# Patient Record
Sex: Female | Born: 1950
Health system: Southern US, Community
[De-identification: ages and names within clinical notes are randomized; demographics above are authoritative.]

## PROBLEM LIST (undated history)

## (undated) DIAGNOSIS — N643 Galactorrhea not associated with childbirth: Secondary | ICD-10-CM

## (undated) DIAGNOSIS — K589 Irritable bowel syndrome without diarrhea: Secondary | ICD-10-CM

## (undated) DIAGNOSIS — M7710 Lateral epicondylitis, unspecified elbow: Secondary | ICD-10-CM

## (undated) DIAGNOSIS — K219 Gastro-esophageal reflux disease without esophagitis: Secondary | ICD-10-CM

## (undated) DIAGNOSIS — E538 Deficiency of other specified B group vitamins: Secondary | ICD-10-CM

## (undated) DIAGNOSIS — D352 Benign neoplasm of pituitary gland: Secondary | ICD-10-CM

## (undated) DIAGNOSIS — K120 Recurrent oral aphthae: Secondary | ICD-10-CM

## (undated) DIAGNOSIS — K649 Unspecified hemorrhoids: Secondary | ICD-10-CM

## (undated) DIAGNOSIS — E559 Vitamin D deficiency, unspecified: Secondary | ICD-10-CM

## (undated) DIAGNOSIS — T7840XA Allergy, unspecified, initial encounter: Secondary | ICD-10-CM

## (undated) DIAGNOSIS — M659 Synovitis and tenosynovitis, unspecified: Secondary | ICD-10-CM

## (undated) DIAGNOSIS — B009 Herpesviral infection, unspecified: Secondary | ICD-10-CM

## (undated) HISTORY — DX: Lateral epicondylitis, unspecified elbow: M77.10

## (undated) HISTORY — DX: Vitamin D deficiency, unspecified: E55.9

## (undated) HISTORY — DX: Galactorrhea not associated with childbirth: N64.3

## (undated) HISTORY — DX: Allergy, unspecified, initial encounter: T78.40XA

## (undated) HISTORY — DX: Gastro-esophageal reflux disease without esophagitis: K21.9

## (undated) HISTORY — DX: Unspecified hemorrhoids: K64.9

## (undated) HISTORY — DX: Irritable bowel syndrome, unspecified: K58.9

## (undated) HISTORY — DX: Recurrent oral aphthae: K12.0

## (undated) HISTORY — DX: Herpesviral infection, unspecified: B00.9

## (undated) HISTORY — DX: Synovitis and tenosynovitis, unspecified: M65.9

## (undated) HISTORY — DX: Deficiency of other specified B group vitamins: E53.8

## (undated) HISTORY — DX: Benign neoplasm of pituitary gland: D35.2

## (undated) HISTORY — PX: DILATION AND CURETTAGE OF UTERUS: SHX78

---

## 1999-04-04 ENCOUNTER — Other Ambulatory Visit: Admission: RE | Admit: 1999-04-04 | Discharge: 1999-04-04 | Payer: Self-pay | Admitting: Gynecology

## 1999-05-02 ENCOUNTER — Encounter: Payer: Self-pay | Admitting: Gynecology

## 1999-05-02 ENCOUNTER — Ambulatory Visit (HOSPITAL_COMMUNITY): Admission: RE | Admit: 1999-05-02 | Discharge: 1999-05-02 | Payer: Self-pay | Admitting: Gynecology

## 1999-08-29 ENCOUNTER — Encounter: Payer: Self-pay | Admitting: Gynecology

## 1999-08-29 ENCOUNTER — Encounter: Admission: RE | Admit: 1999-08-29 | Discharge: 1999-08-29 | Payer: Self-pay | Admitting: Gynecology

## 1999-10-24 ENCOUNTER — Other Ambulatory Visit: Admission: RE | Admit: 1999-10-24 | Discharge: 1999-10-24 | Payer: Self-pay | Admitting: Gynecology

## 2000-09-09 ENCOUNTER — Encounter: Admission: RE | Admit: 2000-09-09 | Discharge: 2000-09-09 | Payer: Self-pay | Admitting: Gynecology

## 2000-09-09 ENCOUNTER — Encounter: Payer: Self-pay | Admitting: Gynecology

## 2000-09-12 ENCOUNTER — Encounter: Payer: Self-pay | Admitting: Gynecology

## 2000-09-12 ENCOUNTER — Encounter: Admission: RE | Admit: 2000-09-12 | Discharge: 2000-09-12 | Payer: Self-pay | Admitting: Gynecology

## 2000-09-24 ENCOUNTER — Ambulatory Visit (HOSPITAL_COMMUNITY): Admission: RE | Admit: 2000-09-24 | Discharge: 2000-09-24 | Payer: Self-pay | Admitting: Internal Medicine

## 2000-09-24 ENCOUNTER — Encounter: Payer: Self-pay | Admitting: Internal Medicine

## 2001-09-18 ENCOUNTER — Ambulatory Visit (HOSPITAL_COMMUNITY): Admission: RE | Admit: 2001-09-18 | Discharge: 2001-09-18 | Payer: Self-pay | Admitting: Gynecology

## 2001-09-18 ENCOUNTER — Encounter: Payer: Self-pay | Admitting: Gynecology

## 2001-11-11 DIAGNOSIS — M65949 Unspecified synovitis and tenosynovitis, unspecified hand: Secondary | ICD-10-CM

## 2001-11-11 DIAGNOSIS — M659 Synovitis and tenosynovitis, unspecified: Secondary | ICD-10-CM

## 2001-11-11 HISTORY — DX: Unspecified synovitis and tenosynovitis, unspecified hand: M65.949

## 2001-11-11 HISTORY — DX: Synovitis and tenosynovitis, unspecified: M65.9

## 2001-11-20 ENCOUNTER — Other Ambulatory Visit: Admission: RE | Admit: 2001-11-20 | Discharge: 2001-11-20 | Payer: Self-pay | Admitting: Gynecology

## 2002-03-03 ENCOUNTER — Encounter: Payer: Self-pay | Admitting: Internal Medicine

## 2002-03-03 ENCOUNTER — Ambulatory Visit (HOSPITAL_COMMUNITY): Admission: RE | Admit: 2002-03-03 | Discharge: 2002-03-03 | Payer: Self-pay | Admitting: Internal Medicine

## 2002-09-22 ENCOUNTER — Encounter: Payer: Self-pay | Admitting: Gynecology

## 2002-09-22 ENCOUNTER — Ambulatory Visit (HOSPITAL_COMMUNITY): Admission: RE | Admit: 2002-09-22 | Discharge: 2002-09-22 | Payer: Self-pay | Admitting: Gynecology

## 2002-11-26 ENCOUNTER — Other Ambulatory Visit: Admission: RE | Admit: 2002-11-26 | Discharge: 2002-11-26 | Payer: Self-pay | Admitting: Gynecology

## 2003-09-26 ENCOUNTER — Ambulatory Visit (HOSPITAL_COMMUNITY): Admission: RE | Admit: 2003-09-26 | Discharge: 2003-09-26 | Payer: Self-pay | Admitting: Gynecology

## 2003-11-29 ENCOUNTER — Other Ambulatory Visit: Admission: RE | Admit: 2003-11-29 | Discharge: 2003-11-29 | Payer: Self-pay | Admitting: Gynecology

## 2003-12-23 ENCOUNTER — Ambulatory Visit (HOSPITAL_COMMUNITY): Admission: RE | Admit: 2003-12-23 | Discharge: 2003-12-23 | Payer: Self-pay | Admitting: Gynecology

## 2004-07-11 ENCOUNTER — Ambulatory Visit (HOSPITAL_COMMUNITY): Admission: RE | Admit: 2004-07-11 | Discharge: 2004-07-11 | Payer: Self-pay | Admitting: Internal Medicine

## 2004-09-26 ENCOUNTER — Ambulatory Visit (HOSPITAL_COMMUNITY): Admission: RE | Admit: 2004-09-26 | Discharge: 2004-09-26 | Payer: Self-pay | Admitting: Gynecology

## 2004-12-04 ENCOUNTER — Other Ambulatory Visit: Admission: RE | Admit: 2004-12-04 | Discharge: 2004-12-04 | Payer: Self-pay | Admitting: Gynecology

## 2005-10-08 ENCOUNTER — Ambulatory Visit (HOSPITAL_COMMUNITY): Admission: RE | Admit: 2005-10-08 | Discharge: 2005-10-08 | Payer: Self-pay | Admitting: Gynecology

## 2005-11-11 DIAGNOSIS — M7710 Lateral epicondylitis, unspecified elbow: Secondary | ICD-10-CM

## 2005-11-11 HISTORY — DX: Lateral epicondylitis, unspecified elbow: M77.10

## 2005-12-06 ENCOUNTER — Other Ambulatory Visit: Admission: RE | Admit: 2005-12-06 | Discharge: 2005-12-06 | Payer: Self-pay | Admitting: Gynecology

## 2006-10-09 ENCOUNTER — Ambulatory Visit (HOSPITAL_COMMUNITY): Admission: RE | Admit: 2006-10-09 | Discharge: 2006-10-09 | Payer: Self-pay | Admitting: Gynecology

## 2006-12-09 ENCOUNTER — Other Ambulatory Visit: Admission: RE | Admit: 2006-12-09 | Discharge: 2006-12-09 | Payer: Self-pay | Admitting: Gynecology

## 2007-10-20 ENCOUNTER — Ambulatory Visit (HOSPITAL_COMMUNITY): Admission: RE | Admit: 2007-10-20 | Discharge: 2007-10-20 | Payer: Self-pay | Admitting: Gynecology

## 2007-10-27 ENCOUNTER — Encounter: Admission: RE | Admit: 2007-10-27 | Discharge: 2007-10-27 | Payer: Self-pay | Admitting: Gynecology

## 2007-12-24 ENCOUNTER — Ambulatory Visit (HOSPITAL_COMMUNITY): Admission: RE | Admit: 2007-12-24 | Discharge: 2007-12-24 | Payer: Self-pay | Admitting: Gynecology

## 2008-05-09 ENCOUNTER — Ambulatory Visit: Payer: Self-pay | Admitting: Internal Medicine

## 2008-06-21 ENCOUNTER — Ambulatory Visit: Payer: Self-pay | Admitting: Internal Medicine

## 2008-10-20 ENCOUNTER — Encounter: Admission: RE | Admit: 2008-10-20 | Discharge: 2008-10-20 | Payer: Self-pay | Admitting: Internal Medicine

## 2008-11-01 ENCOUNTER — Encounter: Admission: RE | Admit: 2008-11-01 | Discharge: 2008-11-01 | Payer: Self-pay | Admitting: Internal Medicine

## 2008-12-19 ENCOUNTER — Other Ambulatory Visit: Admission: RE | Admit: 2008-12-19 | Discharge: 2008-12-19 | Payer: Self-pay | Admitting: Obstetrics and Gynecology

## 2009-01-11 ENCOUNTER — Encounter: Payer: Self-pay | Admitting: Internal Medicine

## 2009-10-23 ENCOUNTER — Encounter: Admission: RE | Admit: 2009-10-23 | Discharge: 2009-10-23 | Payer: Self-pay | Admitting: Obstetrics and Gynecology

## 2010-01-02 ENCOUNTER — Other Ambulatory Visit: Admission: RE | Admit: 2010-01-02 | Discharge: 2010-01-02 | Payer: Self-pay | Admitting: Obstetrics and Gynecology

## 2010-10-25 ENCOUNTER — Encounter
Admission: RE | Admit: 2010-10-25 | Discharge: 2010-10-25 | Payer: Self-pay | Source: Home / Self Care | Attending: Internal Medicine | Admitting: Internal Medicine

## 2010-12-02 ENCOUNTER — Encounter: Payer: Self-pay | Admitting: Gynecology

## 2011-01-10 ENCOUNTER — Other Ambulatory Visit: Payer: Self-pay | Admitting: Obstetrics and Gynecology

## 2011-01-10 ENCOUNTER — Other Ambulatory Visit (HOSPITAL_COMMUNITY)
Admission: RE | Admit: 2011-01-10 | Discharge: 2011-01-10 | Disposition: A | Discharge status: Home / Self Care | Payer: BC Managed Care – PPO | Source: Ambulatory Visit | Attending: Obstetrics and Gynecology | Admitting: Obstetrics and Gynecology

## 2011-01-10 DIAGNOSIS — Z01419 Encounter for gynecological examination (general) (routine) without abnormal findings: Secondary | ICD-10-CM | POA: Insufficient documentation

## 2011-09-12 ENCOUNTER — Other Ambulatory Visit: Payer: Self-pay | Admitting: Internal Medicine

## 2011-09-12 DIAGNOSIS — Z1231 Encounter for screening mammogram for malignant neoplasm of breast: Secondary | ICD-10-CM

## 2011-10-22 ENCOUNTER — Ambulatory Visit
Admission: RE | Admit: 2011-10-22 | Discharge: 2011-10-22 | Disposition: A | Discharge status: Home / Self Care | Payer: BC Managed Care – PPO | Source: Ambulatory Visit | Attending: Internal Medicine | Admitting: Internal Medicine

## 2011-10-22 DIAGNOSIS — Z1231 Encounter for screening mammogram for malignant neoplasm of breast: Secondary | ICD-10-CM

## 2012-02-27 ENCOUNTER — Other Ambulatory Visit: Payer: Self-pay | Admitting: Obstetrics and Gynecology

## 2012-02-27 ENCOUNTER — Other Ambulatory Visit (HOSPITAL_COMMUNITY)
Admission: RE | Admit: 2012-02-27 | Discharge: 2012-02-27 | Disposition: A | Discharge status: Home / Self Care | Payer: BC Managed Care – PPO | Source: Ambulatory Visit | Attending: Obstetrics and Gynecology | Admitting: Obstetrics and Gynecology

## 2012-02-27 DIAGNOSIS — Z01419 Encounter for gynecological examination (general) (routine) without abnormal findings: Secondary | ICD-10-CM | POA: Insufficient documentation

## 2012-04-22 ENCOUNTER — Other Ambulatory Visit: Payer: Self-pay | Admitting: Dermatology

## 2012-09-15 ENCOUNTER — Other Ambulatory Visit: Payer: Self-pay | Admitting: Internal Medicine

## 2012-09-15 DIAGNOSIS — Z1231 Encounter for screening mammogram for malignant neoplasm of breast: Secondary | ICD-10-CM

## 2012-09-16 ENCOUNTER — Other Ambulatory Visit: Payer: Self-pay | Admitting: Internal Medicine

## 2012-09-16 DIAGNOSIS — M949 Disorder of cartilage, unspecified: Secondary | ICD-10-CM

## 2012-10-22 ENCOUNTER — Ambulatory Visit: Payer: BC Managed Care – PPO

## 2012-10-26 ENCOUNTER — Other Ambulatory Visit: Payer: BC Managed Care – PPO

## 2012-10-27 ENCOUNTER — Ambulatory Visit
Admission: RE | Admit: 2012-10-27 | Discharge: 2012-10-27 | Disposition: A | Discharge status: Home / Self Care | Payer: BC Managed Care – PPO | Source: Ambulatory Visit | Attending: Internal Medicine | Admitting: Internal Medicine

## 2012-10-27 DIAGNOSIS — M949 Disorder of cartilage, unspecified: Secondary | ICD-10-CM

## 2012-10-27 DIAGNOSIS — Z1231 Encounter for screening mammogram for malignant neoplasm of breast: Secondary | ICD-10-CM

## 2013-02-03 ENCOUNTER — Telehealth: Payer: Self-pay | Admitting: Internal Medicine

## 2013-02-03 NOTE — Telephone Encounter (Signed)
Left message for pt to call back  °

## 2013-02-04 NOTE — Telephone Encounter (Signed)
If she is not having problems, given her father's age at diagnosis, 10 years would be appropriate (based on national GI guidelines). Thanks

## 2013-02-04 NOTE — Telephone Encounter (Signed)
Recall date changed to 2019 and pt aware.

## 2013-02-04 NOTE — Telephone Encounter (Signed)
Pt had a colon in 2009. Originally the recall was in for 10 years but per procedure note pts father was diagnosed with colon cancer and the recall was changed to 5 years. Pt states that her father was 69 at the time of diagnosis and it was noninvasive. Pt wants to know if she still needs the recall at 5 years due to the age her father was diagnosed. Please advise.

## 2013-03-09 ENCOUNTER — Other Ambulatory Visit: Payer: Self-pay | Admitting: Obstetrics and Gynecology

## 2013-03-09 ENCOUNTER — Other Ambulatory Visit (HOSPITAL_COMMUNITY)
Admission: RE | Admit: 2013-03-09 | Discharge: 2013-03-09 | Disposition: A | Discharge status: Home / Self Care | Payer: BC Managed Care – PPO | Source: Ambulatory Visit | Attending: Obstetrics and Gynecology | Admitting: Obstetrics and Gynecology

## 2013-03-09 DIAGNOSIS — Z1151 Encounter for screening for human papillomavirus (HPV): Secondary | ICD-10-CM | POA: Insufficient documentation

## 2013-03-09 DIAGNOSIS — Z01419 Encounter for gynecological examination (general) (routine) without abnormal findings: Secondary | ICD-10-CM | POA: Insufficient documentation

## 2013-05-19 ENCOUNTER — Encounter: Payer: Self-pay | Admitting: Internal Medicine

## 2013-09-27 ENCOUNTER — Encounter: Payer: Self-pay | Admitting: Internal Medicine

## 2013-09-27 DIAGNOSIS — E559 Vitamin D deficiency, unspecified: Secondary | ICD-10-CM | POA: Insufficient documentation

## 2013-09-27 DIAGNOSIS — E538 Deficiency of other specified B group vitamins: Secondary | ICD-10-CM | POA: Insufficient documentation

## 2013-09-27 DIAGNOSIS — K219 Gastro-esophageal reflux disease without esophagitis: Secondary | ICD-10-CM | POA: Insufficient documentation

## 2013-09-27 DIAGNOSIS — K589 Irritable bowel syndrome without diarrhea: Secondary | ICD-10-CM | POA: Insufficient documentation

## 2013-09-27 NOTE — Patient Instructions (Signed)
Continue diet & medications same as discussed.   Further disposition pending lab results.      Irritable Bowel Syndrome Irritable Bowel Syndrome (IBS) is caused by a disturbance of normal bowel function. Other terms used are spastic colon, mucous colitis, and irritable colon. It does not require surgery, nor does it lead to cancer. There is no cure for IBS. But with proper diet, stress reduction, and medication, you will find that your problems (symptoms) will gradually disappear or improve. IBS is a common digestive disorder. It usually appears in late adolescence or early adulthood. Women develop it twice as often as men. CAUSES  After food has been digested and absorbed in the small intestine, waste material is moved into the colon (large intestine). In the colon, water and salts are absorbed from the undigested products coming from the small intestine. The remaining residue, or fecal material, is held for elimination. Under normal circumstances, gentle, rhythmic contractions on the bowel walls push the fecal material along the colon towards the rectum. In IBS, however, these contractions are irregular and poorly coordinated. The fecal material is either retained too long, resulting in constipation, or expelled too soon, producing diarrhea. SYMPTOMS  The most common symptom of IBS is pain. It is typically in the lower left side of the belly (abdomen). But it may occur anywhere in the abdomen. It can be felt as heartburn, backache, or even as a dull pain in the arms or shoulders. The pain comes from excessive bowel-muscle spasms and from the buildup of gas and fecal material in the colon. This pain:  Can range from sharp belly (abdominal) cramps to a dull, continuous ache.  Usually worsens soon after eating.  Is typically relieved by having a bowel movement or passing gas. Abdominal pain is usually accompanied by constipation. But it may also produce diarrhea. The diarrhea typically  occurs right after a meal or upon arising in the morning. The stools are typically soft and watery. They are often flecked with secretions (mucus). Other symptoms of IBS include:  Bloating.  Loss of appetite.  Heartburn.  Feeling sick to your stomach (nausea).  Belching  Vomiting  Gas. IBS may also cause a number of symptoms that are unrelated to the digestive system:  Fatigue.  Headaches.  Anxiety  Shortness of breath  Difficulty in concentrating.  Dizziness. These symptoms tend to come and go. DIAGNOSIS  The symptoms of IBS closely mimic the symptoms of other, more serious digestive disorders. So your caregiver may wish to perform a variety of additional tests to exclude these disorders. He/she wants to be certain of learning what is wrong (diagnosis). The nature and purpose of each test will be explained to you. TREATMENT A number of medications are available to help correct bowel function and/or relieve bowel spasms and abdominal pain. Among the drugs available are:  Mild, non-irritating laxatives for severe constipation and to help restore normal bowel habits.  Specific anti-diarrheal medications to treat severe or prolonged diarrhea.  Anti-spasmodic agents to relieve intestinal cramps.  Your caregiver may also decide to treat you with a mild tranquilizer or sedative during unusually stressful periods in your life. The important thing to remember is that if any drug is prescribed for you, make sure that you take it exactly as directed. Make sure that your caregiver knows how well it worked for you. HOME CARE INSTRUCTIONS   Avoid foods that are high in fat or oils. Some examples ZOX:WRUEA cream, butter, frankfurters, sausage, and other  fatty meats.  Avoid foods that have a laxative effect, such as fruit, fruit juice, and dairy products.  Cut out carbonated drinks, chewing gum, and "gassy" foods, such as beans and cabbage. This may help relieve bloating and  belching.  Bran taken with plenty of liquids may help relieve constipation.  Keep track of what foods seem to trigger your symptoms.  Avoid emotionally charged situations or circumstances that produce anxiety.  Start or continue exercising.  Get plenty of rest and sleep. MAKE SURE YOU:   Understand these instructions.  Will watch your condition.  Will get help right away if you are not doing well or get worse. Document Released: 10/28/2005 Document Revised: 01/20/2012 Document Reviewed: 06/17/2008 [facility] Patient Information 2014 [place], [place].    Gastroesophageal Reflux Disease, Adult Gastroesophageal reflux disease (GERD) happens when acid from your stomach flows up into the esophagus. When acid comes in contact with the esophagus, the acid causes soreness (inflammation) in the esophagus. Over time, GERD may create small holes (ulcers) in the lining of the esophagus. CAUSES   Increased body weight. This puts pressure on the stomach, making acid rise from the stomach into the esophagus.  Smoking. This increases acid production in the stomach.  Drinking alcohol. This causes decreased pressure in the lower esophageal sphincter (valve or ring of muscle between the esophagus and stomach), allowing acid from the stomach into the esophagus.  Late evening meals and a full stomach. This increases pressure and acid production in the stomach.  A malformed lower esophageal sphincter. Sometimes, no cause is found. SYMPTOMS   Burning pain in the lower part of the mid-chest behind the breastbone and in the mid-stomach area. This may occur twice a week or more often.  Trouble swallowing.  Sore throat.  Dry cough.  Asthma-like symptoms including chest tightness, shortness of breath, or wheezing. DIAGNOSIS  Your caregiver may be able to diagnose GERD based on your symptoms. In some cases, X-rays and other tests may be done to check for complications or to check the condition of  your stomach and esophagus. TREATMENT  Your caregiver may recommend over-the-counter or prescription medicines to help decrease acid production. Ask your caregiver before starting or adding any new medicines.  HOME CARE INSTRUCTIONS   Change the factors that you can control. Ask your caregiver for guidance concerning weight loss, quitting smoking, and alcohol consumption.  Avoid foods and drinks that make your symptoms worse, such as:  Caffeine or alcoholic drinks.  Chocolate.  Peppermint or mint flavorings.  Garlic and onions.  Spicy foods.  Citrus fruits, such as oranges, lemons, or limes.  Tomato-based foods such as sauce, chili, salsa, and pizza.  Fried and fatty foods.  Avoid lying down for the 3 hours prior to your bedtime or prior to taking a nap.  Eat small, frequent meals instead of large meals.  Wear loose-fitting clothing. Do not wear anything tight around your waist that causes pressure on your stomach.  Raise the head of your bed 6 to 8 inches with wood blocks to help you sleep. Extra pillows will not help.  Only take over-the-counter or prescription medicines for pain, discomfort, or fever as directed by your caregiver.  Do not take aspirin, ibuprofen, or other nonsteroidal anti-inflammatory drugs (NSAIDs). SEEK IMMEDIATE MEDICAL CARE IF:   You have pain in your arms, neck, jaw, teeth, or back.  Your pain increases or changes in intensity or duration.  You develop nausea, vomiting, or sweating (diaphoresis).  You develop shortness of  breath, or you faint.  Your vomit is green, yellow, black, or looks like coffee grounds or blood.  Your stool is red, bloody, or black. These symptoms could be signs of other problems, such as heart disease, gastric bleeding, or esophageal bleeding. MAKE SURE YOU:   Understand these instructions.  Will watch your condition.  Will get help right away if you are not doing well or get worse. Document Released:  08/07/2005 Document Revised: 01/20/2012 Document Reviewed: 05/17/2011 [employer] Patient Information 2014 [place], [place]. Vitamin D Deficiency Vitamin D is an important vitamin that your body needs. Having too little of it in your body is called a deficiency. A very bad deficiency can make your bones soft and can cause a condition called rickets.  Vitamin D is important to your body for different reasons, such as:   It helps your body absorb 2 minerals called calcium and phosphorus.  It helps make your bones healthy.  It may prevent some diseases, such as diabetes and multiple sclerosis.  It helps your muscles and heart. You can get vitamin D in several ways. It is a natural part of some foods. The vitamin is also added to some dairy products and cereals. Some people take vitamin D supplements. Also, your body makes vitamin D when you are in the sun. It changes the sun's rays into a form of the vitamin that your body can use. CAUSES   Not eating enough foods that contain vitamin D.  Not getting enough sunlight.  Having certain digestive system diseases that make it hard to absorb vitamin D. These diseases include Crohn's disease, chronic pancreatitis, and cystic fibrosis.  Having a surgery in which part of the stomach or small intestine is removed.  Being obese. Fat cells pull vitamin D out of your blood. That means that obese people may not have enough vitamin D left in their blood and in other body tissues.  Having chronic kidney or liver disease. RISK FACTORS Risk factors are things that make you more likely to develop a vitamin D deficiency. They include:  Being older.  Not being able to get outside very much.  Living in a nursing home.  Having had broken bones.  Having weak or thin bones (osteoporosis).  Having a disease or condition that changes how your body absorbs vitamin D.  Having dark skin.  Some medicines such as seizure medicines or steroids.  Being  overweight or obese. SYMPTOMS Mild cases of vitamin D deficiency may not have any symptoms. If you have a very bad case, symptoms may include:  Bone pain.  Muscle pain.  Falling often.  Broken bones caused by a minor injury, due to osteoporosis. DIAGNOSIS A blood test is the best way to tell if you have a vitamin D deficiency. TREATMENT Vitamin D deficiency can be treated in different ways. Treatment for vitamin D deficiency depends on what is causing it. Options include:  Taking vitamin D supplements.  Taking a calcium supplement. Your caregiver will suggest what dose is best for you. HOME CARE INSTRUCTIONS  Take any supplements that your caregiver prescribes. Follow the directions carefully. Take only the suggested amount.  Have your blood tested 2 months after you start taking supplements.  Eat foods that contain vitamin D. Healthy choices include:  Fortified dairy products, cereals, or juices. Fortified means vitamin D has been added to the food. Check the label on the package to be sure.  Fatty fish like salmon or trout.  Eggs.  Oysters.  Do  not use a tanning bed.  Keep your weight at a healthy level. Lose weight if you need to.  Keep all follow-up appointments. Your caregiver will need to perform blood tests to make sure your vitamin D deficiency is going away. SEEK MEDICAL CARE IF:  You have any questions about your treatment.  You continue to have symptoms of vitamin D deficiency.  You have nausea or vomiting.  You are constipated.  You feel confused.  You have severe abdominal or back pain. MAKE SURE YOU:  Understand these instructions.  Will watch your condition.  Will get help right away if you are not doing well or get worse. Document Released: 01/20/2012 Document Revised: 02/22/2013 Document Reviewed: 01/20/2012 [facility] Patient Information 2014 [place], [place].

## 2013-09-27 NOTE — Progress Notes (Signed)
Patient ID: Kimberly Murphy, female   DOB: 1951/04/28, 62 y.o.   MRN: [id number]   Annual Screening Comprehensive Examination   This very nice 62 yo MWF presents for annual comprehensive screening physical.  Patient has no major health issues. She does have history of GERD and IBS- both of which have been fairly quiescent over the last couple of years with improved diet  Very remotely patient was diagnosed with pituitary microadenomas and saw Dr Danielle Dess who advised that his review of MRI suspected normal variant. Prolactin levels over the years have remained slightly elevated, but stable.   Finally, patient has history of Vitamin D Deficiency with last vitamin D      Current outpatient prescriptions: Aspirin 81 MG tablet, Take 81 mg by mouth daily., CALCIUM CITRATE PO, Take by mouth. Cholecalciferol (VITAMIN D-3) 5000 UNITS TABS, Take by mouth.   Cyanocobalamin (B-12) 1000 MCG SUBL, Place under the tongue.  (VIVELLE-DOT) 0.05 MG/24HR patch, Place 1 patch onto the skin once a week. 5 days per week Progesterone (PROMETRIUM) 200 MG capsule, Take 200 mg by mouth. hyoscyamine (LEVSIN, ANASPAZ) 0.125 MG tablet, Take 0.125 mg by mouth as  Loratadine-pseudoephedrine (CLARITIN-D 12-HOUR) 5-120 MG per tablet, Take 1 tablet by mouth as needed for allergies.   Magnesium 250 MG TABS, Take by mouth 2 (two) times daily meloxicam (MOBIC) 15 MG tablet, Take 15 mg by mouth daily montelukast (SINGULAIR) 10 MG tablet, Take 10 mg by mouth daily    Allergies  Allergen Reactions  . Cimetidine     Diarrhea  . Pse Hcl-Dm Hbr-Guaifenesin Tan     Increased Heart rate  . Seldane [Terfenadine]     Rapid palpitations  . Shellfish Allergy     N/V, diarrhea    Past Medical History  Diagnosis Date  . IBS (irritable bowel syndrome)   . GERD (gastroesophageal reflux disease)   . Vitamin D deficiency   . B12 deficiency   . Allergy   . Galactorrhea     with elevated Prolactin levels  . Pituitary  microadenoma   . Hemorrhoid   . Aphthous stomatitis   . HSV-1 (herpes simplex virus 1) infection   . Tennis elbow 2007    Right elbow  . Synovitis of finger 2003    Left ring finger    Past Surgical History  Procedure Laterality Date  . Dilation and curettage of uterus  1994 & 1996    Family History  Problem Relation Age of Onset  . Cancer Mother     Breast  . Ulcerative colitis Mother   . Arthritis Father   . Hypertension Father   . Macular degeneration Father     History   Social History  . Marital Status: Married    Spouse Name: N/A    Number of Children: N/A  . Years of Education: N/A   Occupational History  . Not on file.   Social History Main Topics  . Smoking status: Never Smoker   . Smokeless tobacco: Not on file  . Alcohol Use: Yes  . Drug Use: Not on file      ROS Constitutional: Denies fever, chills, weight loss/gain, headaches, insomnia, fatigue, night sweats, and change in appetite. Eyes: Denies redness, blurred vision, diplopia, discharge, itchy, watery eyes.  ENT: Denies discharge, congestion, post nasal drip, epistaxis, sore throat, earache, hearing loss, dental pain, Tinnitus, Vertigo, Sinus pain, snoring.  Cardio: Denies chest pain, palpitations, irregular heartbeat, syncope, dyspnea, diaphoresis, orthopnea, PND, claudication, edema Respiratory: denies cough,  dyspnea, DOE, pleurisy, hoarseness, laryngitis, wheezing.  Gastrointestinal: Denies dysphagia, heartburn, reflux, water brash, pain, cramps, nausea, vomiting, bloating, diarrhea, constipation, hematemesis, melena, hematochezia, jaundice, hemorrhoids Genitourinary: Denies dysuria, frequency, urgency, nocturia, hesitancy, discharge, hematuria, flank pain Breast: Breast lumps, nipple discharge, bleeding.  Musculoskeletal: Denies arthralgia, myalgia, stiffness, Jt. Swelling, pain, limp, and strain/sprain. Skin: Denies puritis, rash, hives, warts, acne, eczema, changing in skin lesion Neuro:  Weakness, tremor, incoordination, spasms, paresthesia, pain Psychiatric: Denies confusion, memory loss, sensory loss. Endocrine: Denies change in weight, skin, hair change, nocturia, and paresthesia, diabetic polys, visual blurring, hyper /hypo glycemic episodes.  Heme/Lymph: No excessive bleeding, bruising, enlarged lymph nodes.  Vital Signs as recorded are WNL  Physical Exam General Appearance: Well nourished, in no apparent distress. Eyes: PERRLA, EOMs, conjunctiva no swelling or erythema, normal fundi and vessels. Sinuses: No frontal/maxillary tenderness ENT/Mouth: EACs patent / TMs  nl. Nares clear without erythema, swelling, mucoid exudates. Oral hygiene is good. No erythema, swelling, or exudate. Tongue normal, non-obstructing. Tonsils not swollen or erythematous. Hearing normal.  Neck: Supple, thyroid normal. No bruits, nodes or JVD. Respiratory: Respiratory effort normal.  BS equal and clear bilateral without rales, rhonci, wheezing or stridor. Cardio: Heart sounds are normal with regular rate and rhythm and no murmurs, rubs or gallops. Peripheral pulses are normal and equal bilaterally without edema. No aortic or femoral bruits. Chest: symmetric with normal excursions and percussion. Breasts: Symmetric, without lumps, nipple discharge, retractions, or fibrocystic changes.  Abdomen: Flat, soft, with bowl sounds. Nontender, no guarding, rebound, hernias, masses, or organomegaly.  Lymphatics: Non tender without lymphadenopathy.  Musculoskeletal: Full ROM all peripheral extremities, joint stability, 5/5 strength, and normal gait. Skin: Warm and dry without rashes, lesions, cyanosis, clubbing or  ecchymosis.  Neuro: Cranial nerves intact, reflexes equal bilaterally. Normal muscle tone, no cerebellar symptoms. Sensation intact.  Pysch: Awake and oriented X 3, normal affect, Insight and Judgment appropriate.   Assessment and Plan  1. Annual Screening Examination 2. GERD 3. IBS 4.  Vitamin D Deficiency 5. Climacteric 6. Pit. Adenoma, Hx (Altho Dx is suspect)  Discussed with patient to defer MRI unless Prolactin levels rise and to continue annual f/u with her ophthalmologist.  Continue prudent diet as discussed, weight control, regular exercise, and medications. Routine screening labs and tests as requested with regular follow-up as recommended.

## 2013-09-28 ENCOUNTER — Encounter: Payer: Self-pay | Admitting: Internal Medicine

## 2013-09-28 ENCOUNTER — Ambulatory Visit: Payer: BC Managed Care – PPO | Admitting: Internal Medicine

## 2013-09-28 VITALS — BP 118/82 | HR 76 | Temp 97.9°F | Resp 18 | Ht 66.0 in | Wt 134.0 lb

## 2013-09-28 DIAGNOSIS — Z0000 Encounter for general adult medical examination without abnormal findings: Secondary | ICD-10-CM

## 2013-09-28 DIAGNOSIS — D352 Benign neoplasm of pituitary gland: Secondary | ICD-10-CM

## 2013-09-28 DIAGNOSIS — R030 Elevated blood-pressure reading, without diagnosis of hypertension: Secondary | ICD-10-CM

## 2013-09-28 DIAGNOSIS — R7402 Elevation of levels of lactic acid dehydrogenase (LDH): Secondary | ICD-10-CM

## 2013-09-28 DIAGNOSIS — Z113 Encounter for screening for infections with a predominantly sexual mode of transmission: Secondary | ICD-10-CM

## 2013-09-28 DIAGNOSIS — Z23 Encounter for immunization: Secondary | ICD-10-CM

## 2013-09-28 DIAGNOSIS — Z1212 Encounter for screening for malignant neoplasm of rectum: Secondary | ICD-10-CM

## 2013-09-28 DIAGNOSIS — E559 Vitamin D deficiency, unspecified: Secondary | ICD-10-CM

## 2013-09-28 LAB — HEMOGLOBIN A1C
Hgb A1c MFr Bld: 5.4 % (ref <5.7)
Mean Plasma Glucose: 108 mg/dL (ref <117)

## 2013-09-28 LAB — CBC WITH DIFFERENTIAL/PLATELET
Eosinophils Absolute: 0.2 10*3/uL (ref 0.0–0.7)
Eosinophils Relative: 3 % (ref 0–5)
HCT: 36.1 % (ref 36.0–46.0)
Hemoglobin: 12.4 g/dL (ref 12.0–15.0)
Lymphs Abs: 1.6 10*3/uL (ref 0.7–4.0)
MCH: 29.6 pg (ref 26.0–34.0)
MCV: 86.2 fL (ref 78.0–100.0)
Monocytes Relative: 8 % (ref 3–12)
Neutro Abs: 3.5 10*3/uL (ref 1.7–7.7)
RBC: 4.19 MIL/uL (ref 3.87–5.11)
RDW: 14.5 % (ref 11.5–15.5)

## 2013-09-28 LAB — LIPID PANEL
Cholesterol: 169 mg/dL (ref 0–200)
Triglycerides: 99 mg/dL (ref <150)
VLDL: 20 mg/dL (ref 0–40)

## 2013-09-28 LAB — HEPATIC FUNCTION PANEL
ALT: 13 U/L (ref 0–35)
AST: 16 U/L (ref 0–37)
Albumin: 4.3 g/dL (ref 3.5–5.2)
Alkaline Phosphatase: 43 U/L (ref 39–117)
Bilirubin, Direct: 0.1 mg/dL (ref 0.0–0.3)
Total Protein: 6.6 g/dL (ref 6.0–8.3)

## 2013-09-28 LAB — TSH: TSH: 2.945 u[IU]/mL (ref 0.350–4.500)

## 2013-09-28 LAB — BASIC METABOLIC PANEL WITH GFR
CO2: 26 mEq/L (ref 19–32)
Calcium: 9.3 mg/dL (ref 8.4–10.5)
Creat: 0.6 mg/dL (ref 0.50–1.10)
Glucose, Bld: 91 mg/dL (ref 70–99)

## 2013-09-28 LAB — MAGNESIUM: Magnesium: 1.9 mg/dL (ref 1.5–2.5)

## 2013-09-28 MED ORDER — ACYCLOVIR 5 % EX OINT: 1.0000 "application " | TOPICAL_OINTMENT | CUTANEOUS | Status: DC

## 2013-09-29 ENCOUNTER — Other Ambulatory Visit: Payer: Self-pay

## 2013-09-29 DIAGNOSIS — Z1231 Encounter for screening mammogram for malignant neoplasm of breast: Secondary | ICD-10-CM

## 2013-09-29 LAB — HEPATITIS B CORE ANTIBODY, TOTAL: Hep B Core Total Ab: NONREACTIVE

## 2013-09-29 LAB — HEPATITIS B SURFACE ANTIBODY,QUALITATIVE: Hep B S Ab: NEGATIVE

## 2013-09-29 LAB — MICROALBUMIN / CREATININE URINE RATIO: Microalb, Ur: 0.5 mg/dL (ref 0.00–1.89)

## 2013-09-29 LAB — HIV ANTIBODY (ROUTINE TESTING W REFLEX): HIV: NONREACTIVE

## 2013-09-29 LAB — HEPATITIS A ANTIBODY, TOTAL: Hep A Total Ab: REACTIVE — AB

## 2013-09-29 LAB — HEPATITIS C ANTIBODY: HCV Ab: NEGATIVE

## 2013-09-29 LAB — INSULIN, FASTING: Insulin fasting, serum: 7 u[IU]/mL (ref 3–28)

## 2013-09-29 LAB — PROLACTIN: Prolactin: 56 ng/mL

## 2013-09-30 LAB — HEPATITIS B E ANTIBODY: Hepatitis Be Antibody: NEGATIVE

## 2013-10-29 ENCOUNTER — Ambulatory Visit
Admission: RE | Admit: 2013-10-29 | Discharge: 2013-10-29 | Disposition: A | Discharge status: Home / Self Care | Payer: BC Managed Care – PPO | Source: Ambulatory Visit

## 2013-10-29 DIAGNOSIS — Z1231 Encounter for screening mammogram for malignant neoplasm of breast: Secondary | ICD-10-CM

## 2013-11-25 ENCOUNTER — Encounter: Payer: Self-pay | Admitting: Internal Medicine

## 2013-11-29 ENCOUNTER — Other Ambulatory Visit: Payer: Self-pay | Admitting: Emergency Medicine

## 2013-12-29 ENCOUNTER — Telehealth: Payer: Self-pay | Admitting: *Deleted

## 2013-12-29 NOTE — Telephone Encounter (Signed)
Patient called and requested the name of a doctor to treat TMJ. States her previous doctor retired.  Per Dr Melford Aase, patient should call Dr Augustina Mood.

## 2014-01-07 ENCOUNTER — Ambulatory Visit: Payer: Self-pay | Admitting: Internal Medicine

## 2014-03-08 ENCOUNTER — Other Ambulatory Visit: Payer: Self-pay | Admitting: Obstetrics and Gynecology

## 2014-03-08 ENCOUNTER — Other Ambulatory Visit (HOSPITAL_COMMUNITY)
Admission: RE | Admit: 2014-03-08 | Discharge: 2014-03-08 | Disposition: A | Discharge status: Home / Self Care | Payer: BC Managed Care – PPO | Source: Ambulatory Visit | Attending: Obstetrics and Gynecology | Admitting: Obstetrics and Gynecology

## 2014-03-08 DIAGNOSIS — Z01419 Encounter for gynecological examination (general) (routine) without abnormal findings: Secondary | ICD-10-CM | POA: Insufficient documentation

## 2014-09-28 ENCOUNTER — Other Ambulatory Visit: Payer: Self-pay

## 2014-09-28 ENCOUNTER — Other Ambulatory Visit: Payer: Self-pay | Admitting: Internal Medicine

## 2014-09-28 ENCOUNTER — Encounter: Payer: Self-pay | Admitting: Internal Medicine

## 2014-09-28 ENCOUNTER — Ambulatory Visit (INDEPENDENT_AMBULATORY_CARE_PROVIDER_SITE_OTHER): Payer: BC Managed Care – PPO | Admitting: Internal Medicine

## 2014-09-28 VITALS — BP 120/68 | HR 72 | Temp 98.0°F | Resp 16 | Ht 66.0 in | Wt 133.0 lb

## 2014-09-28 DIAGNOSIS — Z1231 Encounter for screening mammogram for malignant neoplasm of breast: Secondary | ICD-10-CM

## 2014-09-28 DIAGNOSIS — Z23 Encounter for immunization: Secondary | ICD-10-CM

## 2014-09-28 DIAGNOSIS — E538 Deficiency of other specified B group vitamins: Secondary | ICD-10-CM

## 2014-09-28 DIAGNOSIS — M8580 Other specified disorders of bone density and structure, unspecified site: Secondary | ICD-10-CM

## 2014-09-28 DIAGNOSIS — Z0001 Encounter for general adult medical examination with abnormal findings: Secondary | ICD-10-CM

## 2014-09-28 DIAGNOSIS — D352 Benign neoplasm of pituitary gland: Secondary | ICD-10-CM

## 2014-09-28 DIAGNOSIS — R6889 Other general symptoms and signs: Secondary | ICD-10-CM

## 2014-09-28 DIAGNOSIS — E559 Vitamin D deficiency, unspecified: Secondary | ICD-10-CM

## 2014-09-28 DIAGNOSIS — IMO0001 Reserved for inherently not codable concepts without codable children: Secondary | ICD-10-CM

## 2014-09-28 DIAGNOSIS — K589 Irritable bowel syndrome without diarrhea: Secondary | ICD-10-CM

## 2014-09-28 DIAGNOSIS — R030 Elevated blood-pressure reading, without diagnosis of hypertension: Secondary | ICD-10-CM

## 2014-09-28 DIAGNOSIS — Z1212 Encounter for screening for malignant neoplasm of rectum: Secondary | ICD-10-CM

## 2014-09-28 LAB — CBC WITH DIFFERENTIAL/PLATELET
BASOS ABS: 0.1 10*3/uL (ref 0.0–0.1)
BASOS PCT: 1 % (ref 0–1)
EOS ABS: 0.2 10*3/uL (ref 0.0–0.7)
Eosinophils Relative: 3 % (ref 0–5)
HCT: 37.5 % (ref 36.0–46.0)
Hemoglobin: 12.2 g/dL (ref 12.0–15.0)
Lymphocytes Relative: 28 % (ref 12–46)
Lymphs Abs: 1.5 10*3/uL (ref 0.7–4.0)
MCH: 29.2 pg (ref 26.0–34.0)
MCHC: 32.5 g/dL (ref 30.0–36.0)
MCV: 89.7 fL (ref 78.0–100.0)
MONOS PCT: 7 % (ref 3–12)
MPV: 9 fL — AB (ref 9.4–12.4)
Monocytes Absolute: 0.4 10*3/uL (ref 0.1–1.0)
NEUTROS ABS: 3.2 10*3/uL (ref 1.7–7.7)
Neutrophils Relative %: 61 % (ref 43–77)
Platelets: 299 10*3/uL (ref 150–400)
RBC: 4.18 MIL/uL (ref 3.87–5.11)
RDW: 14 % (ref 11.5–15.5)
WBC: 5.2 10*3/uL (ref 4.0–10.5)

## 2014-09-28 LAB — HEMOGLOBIN A1C
Hgb A1c MFr Bld: 5.5 % (ref <5.7)
Mean Plasma Glucose: 111 mg/dL (ref <117)

## 2014-09-28 MED ORDER — TETANUS-DIPHTHERIA TOXOIDS TD 5-2 LFU IM INJ: 0.5000 mL | INJECTION | Freq: Once | INTRAMUSCULAR | Status: DC

## 2014-09-28 NOTE — Patient Instructions (Signed)
Recommend the book "The END of DIETING" by Dr Baker Janus   and the book "The END of DIABETES " by Dr Excell Seltzer  At [email or web address] - get book & Audio CD's      Being diabetic has a  300% increased risk for heart attack, stroke, cancer, and alzheimer- type vascular dementia. It is very important that you work harder with diet by avoiding all foods that are white except chicken & fish. Avoid white rice (brown & wild rice is OK), white potatoes (sweetpotatoes in moderation is OK), White bread or wheat bread or anything made out of white flour like bagels, donuts, rolls, buns, biscuits, cakes, pastries, cookies, pizza crust, and pasta (made from white flour & egg whites) - vegetarian pasta or spinach or wheat pasta is OK. Multigrain breads like Arnold's or [employer], or multigrain sandwich thins or flatbreads.  Diet, exercise and weight loss can reverse and cure diabetes in the early stages.  Diet, exercise and weight loss is very important in the control and prevention of complications of diabetes which affects every system in your body, ie. Brain - dementia/stroke, eyes - glaucoma/blindness, heart - heart attack/heart failure, kidneys - dialysis, stomach - gastric paralysis, intestines - malabsorption, nerves - severe painful neuritis, circulation - gangrene & loss of a leg(s), and finally cancer and Alzheimers.    I recommend avoid fried & greasy foods,  sweets/candy, white rice (brown or wild rice or Quinoa is OK), white potatoes (sweet potatoes are OK) - anything made from white flour - bagels, doughnuts, rolls, buns, biscuits,white and wheat breads, pizza crust and traditional pasta made of white flour & egg white(vegetarian pasta or spinach or wheat pasta is OK).  Multi-grain bread is OK - like multi-grain flat bread or sandwich thins. Avoid alcohol in excess. Exercise is also important.    Eat all the vegetables you want - avoid meat, especially red meat and dairy - especially cheese.  Cheese  is the most concentrated form of trans-fats which is the worst thing to clog up our arteries. Veggie cheese is OK which can be found in the fresh produce section at [employer] or [employer] or [employer]  Preventive Care for Adults A healthy lifestyle and preventive care can promote health and wellness. Preventive health guidelines for women include the following key practices.  A routine yearly physical is a good way to check with your health care provider about your health and preventive screening. It is a chance to share any concerns and updates on your health and to receive a thorough exam.  Visit your dentist for a routine exam and preventive care every 6 months. Brush your teeth twice a day and floss once a day. Good oral hygiene prevents tooth decay and gum disease.  The frequency of eye exams is based on your age, health, family medical history, use of contact lenses, and other factors. Follow your health care provider's recommendations for frequency of eye exams.  Eat a healthy diet. Foods like vegetables, fruits, whole grains, low-fat dairy products, and lean protein foods contain the nutrients you need without too many calories. Decrease your intake of foods high in solid fats, added sugars, and salt. Eat the right amount of calories for you.Get information about a proper diet from your health care provider, if necessary.  Regular physical exercise is one of the most important things you can do for your health. Most adults should get at least 150 minutes of moderate-intensity exercise (any activity that increases  your heart rate and causes you to sweat) each week. In addition, most adults need muscle-strengthening exercises on 2 or more days a week.  Maintain a healthy weight. The body mass index (BMI) is a screening tool to identify possible weight problems. It provides an estimate of body fat based on height and weight. Your health care provider can find your BMI and can help you  achieve or maintain a healthy weight.For adults 20 years and older:  A BMI below 18.5 is considered underweight.  A BMI of 18.5 to 24.9 is normal.  A BMI of 25 to 29.9 is considered overweight.  A BMI of 30 and above is considered obese.  Maintain normal blood lipids and cholesterol levels by exercising and minimizing your intake of saturated fat. Eat a balanced diet with plenty of fruit and vegetables. Blood tests for lipids and cholesterol should begin at age 38 and be repeated every 5 years. If your lipid or cholesterol levels are high, you are over 50, or you are at high risk for heart disease, you may need your cholesterol levels checked more frequently.Ongoing high lipid and cholesterol levels should be treated with medicines if diet and exercise are not working.  If you smoke, find out from your health care provider how to quit. If you do not use tobacco, do not start.  Lung cancer screening is recommended for adults aged 64-80 years who are at high risk for developing lung cancer because of a history of smoking. A yearly low-dose CT scan of the lungs is recommended for people who have at least a 30-pack-year history of smoking and are a current smoker or have quit within the past 15 years. A pack year of smoking is smoking an average of 1 pack of cigarettes a day for 1 year (for example: 1 pack a day for 30 years or 2 packs a day for 15 years). Yearly screening should continue until the smoker has stopped smoking for at least 15 years. Yearly screening should be stopped for people who develop a health problem that would prevent them from having lung cancer treatment.  High blood pressure causes heart disease and increases the risk of stroke. Your blood pressure should be checked at least every 1 to 2 years. Ongoing high blood pressure should be treated with medicines if weight loss and exercise do not work.  If you are 79-57 years old, ask your health care provider if you should take  aspirin to prevent strokes.  Diabetes screening involves taking a blood sample to check your fasting blood sugar level. This should be done once every 3 years, after age 37, if you are within normal weight and without risk factors for diabetes. Testing should be considered at a younger age or be carried out more frequently if you are overweight and have at least 1 risk factor for diabetes.  Breast cancer screening is essential preventive care for women. You should practice "breast self-awareness." This means understanding the normal appearance and feel of your breasts and may include breast self-examination. Any changes detected, no matter how small, should be reported to a health care provider. Women in their 76s and 30s should have a clinical breast exam (CBE) by a health care provider as part of a regular health exam every 1 to 3 years. After age 55, women should have a CBE every year. Starting at age 79, women should consider having a mammogram (breast X-ray test) every year. Women who have a family history of breast  cancer should talk to their health care provider about genetic screening. Women at a high risk of breast cancer should talk to their health care providers about having an MRI and a mammogram every year.  Breast cancer gene (BRCA)-related cancer risk assessment is recommended for women who have family members with BRCA-related cancers. BRCA-related cancers include breast, ovarian, tubal, and peritoneal cancers. Having family members with these cancers may be associated with an increased risk for harmful changes (mutations) in the breast cancer genes BRCA1 and BRCA2. Results of the assessment will determine the need for genetic counseling and BRCA1 and BRCA2 testing.  Routine pelvic exams to screen for cancer are no longer recommended for nonpregnant women who are considered low risk for cancer of the pelvic organs (ovaries, uterus, and vagina) and who do not have symptoms. Ask your health  care provider if a screening pelvic exam is right for you.  If you have had past treatment for cervical cancer or a condition that could lead to cancer, you need Pap tests and screening for cancer for at least 20 years after your treatment. If Pap tests have been discontinued, your risk factors (such as having a new sexual partner) need to be reassessed to determine if screening should be resumed. Some women have medical problems that increase the chance of getting cervical cancer. In these cases, your health care provider may recommend more frequent screening and Pap tests.  Colorectal cancer can be detected and often prevented. Most routine colorectal cancer screening begins at the age of 58 years and continues through age 44 years. However, your health care provider may recommend screening at an earlier age if you have risk factors for colon cancer. On a yearly basis, your health care provider may provide home test kits to check for hidden blood in the stool. Use of a small camera at the end of a tube, to directly examine the colon (sigmoidoscopy or colonoscopy), can detect the earliest forms of colorectal cancer. Talk to your health care provider about this at age 84, when routine screening begins. Direct exam of the colon should be repeated every 5-10 years through age 33 years, unless early forms of pre-cancerous polyps or small growths are found.  Hepatitis C blood testing is recommended for all people born from [birth year] through 1965 and any individual with known risks for hepatitis C.  Pra  Osteoporosis is a disease in which the bones lose minerals and strength with aging. This can result in serious bone fractures or breaks. The risk of osteoporosis can be identified using a bone density scan. Women ages 36 years and over and women at risk for fractures or osteoporosis should discuss screening with their health care providers. Ask your health care provider whether you should take a calcium supplement  or vitamin D to reduce the rate of osteoporosis.  Menopause can be associated with physical symptoms and risks. Hormone replacement therapy is available to decrease symptoms and risks. You should talk to your health care provider about whether hormone replacement therapy is right for you.  Use sunscreen. Apply sunscreen liberally and repeatedly throughout the day. You should seek shade when your shadow is shorter than you. Protect yourself by wearing long sleeves, pants, a wide-brimmed hat, and sunglasses year round, whenever you are outdoors.  Once a month, do a whole body skin exam, using a mirror to look at the skin on your back. Tell your health care provider of new moles, moles that have irregular borders, moles that are  larger than a pencil eraser, or moles that have changed in shape or color.  Stay current with required vaccines (immunizations).  Influenza vaccine. All adults should be immunized every year.  Tetanus, diphtheria, and acellular pertussis (Td, Tdap) vaccine. Pregnant women should receive 1 dose of Tdap vaccine during each pregnancy. The dose should be obtained regardless of the length of time since the last dose. Immunization is preferred during the 27th-36th week of gestation. An adult who has not previously received Tdap or who does not know her vaccine status should receive 1 dose of Tdap. This initial dose should be followed by tetanus and diphtheria toxoids (Td) booster doses every 10 years. Adults with an unknown or incomplete history of completing a 3-dose immunization series with Td-containing vaccines should begin or complete a primary immunization series including a Tdap dose. Adults should receive a Td booster every 10 years.  Varicella vaccine. An adult without evidence of immunity to varicella should receive 2 doses or a second dose if she has previously received 1 dose. Pregnant females who do not have evidence of immunity should receive the first dose after  pregnancy. This first dose should be obtained before leaving the health care facility. The second dose should be obtained 4-8 weeks after the first dose.  Human papillomavirus (HPV) vaccine. Females aged 13-26 years who have not received the vaccine previously should obtain the 3-dose series. The vaccine is not recommended for use in pregnant females. However, pregnancy testing is not needed before receiving a dose. If a female is found to be pregnant after receiving a dose, no treatment is needed. In that case, the remaining doses should be delayed until after the pregnancy. Immunization is recommended for any person with an immunocompromised condition through the age of 26 years if she did not get any or all doses earlier. During the 3-dose series, the second dose should be obtained 4-8 weeks after the first dose. The third dose should be obtained 24 weeks after the first dose and 16 weeks after the second dose.  Zoster vaccine. One dose is recommended for adults aged 60 years or older unless certain conditions are present.  Measles, mumps, and rubella (MMR) vaccine. Adults born before 1957 generally are considered immune to measles and mumps. Adults born in 1957 or later should have 1 or more doses of MMR vaccine unless there is a contraindication to the vaccine or there is laboratory evidence of immunity to each of the three diseases. A routine second dose of MMR vaccine should be obtained at least 28 days after the first dose for students attending postsecondary schools, health care workers, or international travelers. People who received inactivated measles vaccine or an unknown type of measles vaccine during 1963-1967 should receive 2 doses of MMR vaccine. People who received inactivated mumps vaccine or an unknown type of mumps vaccine before 1979 and are at high risk for mumps infection should consider immunization with 2 doses of MMR vaccine. For females of childbearing age, rubella immunity should  be determined. If there is no evidence of immunity, females who are not pregnant should be vaccinated. If there is no evidence of immunity, females who are pregnant should delay immunization until after pregnancy. Unvaccinated health care workers born before 1957 who lack laboratory evidence of measles, mumps, or rubella immunity or laboratory confirmation of disease should consider measles and mumps immunization with 2 doses of MMR vaccine or rubella immunization with 1 dose of MMR vaccine.  Pneumococcal 13-valent conjugate (PCV13) vaccine. When   indicated, a person who is uncertain of her immunization history and has no record of immunization should receive the PCV13 vaccine. An adult aged 73 years or older who has certain medical conditions and has not been previously immunized should receive 1 dose of PCV13 vaccine. This PCV13 should be followed with a dose of pneumococcal polysaccharide (PPSV23) vaccine. The PPSV23 vaccine dose should be obtained at least 8 weeks after the dose of PCV13 vaccine. An adult aged 81 years or older who has certain medical conditions and previously received 1 or more doses of PPSV23 vaccine should receive 1 dose of PCV13. The PCV13 vaccine dose should be obtained 1 or more years after the last PPSV23 vaccine dose.    Pneumococcal polysaccharide (PPSV23) vaccine. When PCV13 is also indicated, PCV13 should be obtained first. All adults aged 69 years and older should be immunized. An adult younger than age 35 years who has certain medical conditions should be immunized. Any person who resides in a nursing home or long-term care facility should be immunized. An adult smoker should be immunized. People with an immunocompromised condition and certain other conditions should receive both PCV13 and PPSV23 vaccines. People with human immunodeficiency virus (HIV) infection should be immunized as soon as possible after diagnosis. Immunization during chemotherapy or radiation therapy should  be avoided. Routine use of PPSV23 vaccine is not recommended for American Indians, [place]atives, or people younger than 65 years unless there are medical conditions that require PPSV23 vaccine. When indicated, people who have unknown immunization and have no record of immunization should receive PPSV23 vaccine. One-time revaccination 5 years after the first dose of PPSV23 is recommended for people aged 19-64 years who have chronic kidney failure, nephrotic syndrome, asplenia, or immunocompromised conditions. People who received 1-2 doses of PPSV23 before age 79 years should receive another dose of PPSV23 vaccine at age 73 years or later if at least 5 years have passed since the previous dose. Doses of PPSV23 are not needed for people immunized with PPSV23 at or after age 19 years.  Preventive Services / Frequency   Ages 25 to 65 years  Blood pressure check.  Lipid and cholesterol check.  Lung cancer screening. / Every year if you are aged 8-80 years and have a 30-pack-year history of smoking and currently smoke or have quit within the past 15 years. Yearly screening is stopped once you have quit smoking for at least 15 years or develop a health problem that would prevent you from having lung cancer treatment.  Clinical breast exam.** / Every year after age 28 years.  BRCA-related cancer risk assessment.** / For women who have family members with a BRCA-related cancer (breast, ovarian, tubal, or peritoneal cancers).  Mammogram.** / Every year beginning at age 57 years and continuing for as long as you are in good health. Consult with your health care provider.  Pap test.** / Every 3 years starting at age 57 years through age 25 or 13 years with a history of 3 consecutive normal Pap tests.  HPV screening.** / Every 3 years from ages 21 years through ages 60 to 48 years with a history of 3 consecutive normal Pap tests.  Fecal occult blood test (FOBT) of stool. / Every year beginning at age 77  years and continuing until age 57 years. You may not need to do this test if you get a colonoscopy every 10 years.  Flexible sigmoidoscopy or colonoscopy.** / Every 5 years for a flexible sigmoidoscopy or every 10 years  for a colonoscopy beginning at age 104 years and continuing until age 39 years.  Hepatitis C blood test.** / For all people born from [birth year] through 1963 and any individual with known risks for hepatitis C.  Skin self-exam. / Monthly.  Influenza vaccine. / Every year.  Tetanus, diphtheria, and acellular pertussis (Tdap/Td) vaccine.** / Consult your health care provider. Pregnant women should receive 1 dose of Tdap vaccine during each pregnancy. 1 dose of Td every 10 years.  Varicella vaccine.** / Consult your health care provider. Pregnant females who do not have evidence of immunity should receive the first dose after pregnancy.  Zoster vaccine.** / 1 dose for adults aged 47 years or older.  Pneumococcal 13-valent conjugate (PCV13) vaccine.** / Consult your health care provider.  Pneumococcal polysaccharide (PPSV23) vaccine.** / 1 to 2 doses if you smoke cigarettes or if you have certain conditions.  Meningococcal vaccine.** / Consult your health care provider.  Hepatitis A vaccine.** / Consult your health care provider.  Hepatitis B vaccine.** / Consult your health care provider. Screening for abdominal aortic aneurysm (AAA)  by ultrasound is recommended for people over 50 who have history of high blood pressure or who are current or former smokers.

## 2014-09-28 NOTE — Progress Notes (Signed)
Patient ID: Kimberly Murphy, female   DOB: 1951/01/16, 63 y.o.   MRN: [id number]     Annual Screening Comprehensive Examination  This very nice 63 y.o.MWF presents for complete physical.  Patient has been followed for labile HTN, Prediabetes, Hyperlipidemia, and Vitamin D Deficiency.   Labile  HTN predates since 2006 and patient has been followed expectantly.  Patient's BP has been controlled and patient denies any cardiac symptoms as chest pain, palpitations, shortness of breath, dizziness or ankle swelling. Today's BP: 120/68 mmHg    Patient's hyperlipidemia is controlled with diet. Patient denies myalgias or other medication SE's. Last lipids were at goal Total  Chol 169; HDL 87; LDL62; Triglycerides 99 on 09/28/2013.   Patient is screened for  prediabetes  and patient denies reactive hypoglycemic symptoms, visual blurring, diabetic polys, or paresthesias. Last A1c was  5.4% on 09/28/2013.    Patient has remote hx/o pituitary microadenoma with elevated Prolactin levels found in the course of evaluation for galactorrhea in 2000. Patient has been evaluated by Dr Ellene Route and also followed by Dr Claudean Kinds for VF testing  and as there has been no change in 15 years, annual screening is deferred.   Finally, patient has history of Vitamin D Deficiency and last Vitamin D was 80 on 09/28/2013.  Medication Sig  . acyclovir ointment (ZOVIRAX) 5 % Apply 1 application topically every 3 (three) hours.  Marland Kitchen aspirin 81 MG tablet Take 81 mg by mouth daily.  Marland Kitchen CALCIUM CITRATE PO Take by mouth.  Marland Kitchen VITAMIN D 5000 UNITS  Take by mouth.  . [id number] MCG SUBL Place under the tongue.  Marland Kitchen estradiol (VIVELLE-DOT) 0.05 MG/24HR patch Place 1 patch onto the skin once a week. 5 days per week  . hyoscyamine  0.125 MG tablet Take 0.125 mg by mouth as needed.  Marland Kitchen CLARITIN-D 12-HR 5-120 MG  Take 1 tablet by mouth as needed for allergies.  . Magnesium 250 MG TABS Take by mouth 2 (two) times daily.  . progesterone (PROMETRIUM)  200 MG capsule Take 200 mg by mouth.   Allergies  Allergen Reactions  . Cimetidine     Diarrhea  . Pse Hcl-Dm Hbr-Guaifenesin Tan     Increased Heart rate  . Seldane [Terfenadine]     Rapid palpitations  . Shellfish Allergy     N/V, diarrhea   Past Medical History  Diagnosis Date  . IBS (irritable bowel syndrome)   . GERD (gastroesophageal reflux disease)   . Vitamin D deficiency   . B12 deficiency   . Allergy   . Galactorrhea     with elevated Prolactin levels  . Pituitary microadenoma   . Hemorrhoid   . Aphthous stomatitis   . HSV-1 (herpes simplex virus 1) infection   . Tennis elbow 2007    Right elbow  . Synovitis of finger 2003    Left ring finger   Health Maintenance  Topic Date Due  . INFLUENZA VACCINE  06/12/2015  . MAMMOGRAM  10/30/2015  . PAP SMEAR  03/08/2017  . COLONOSCOPY  06/11/2018  . TETANUS/TDAP  09/28/2024  . ZOSTAVAX  Completed   Immunization History  Administered Date(s) Administered  . DT 09/28/2014  . Influenza Split 09/28/2013, 09/28/2014  . PPD Test 09/28/2013  . Pneumococcal Conjugate-13 09/28/2014  . Pneumococcal-Unspecified 11/11/1998  . Td 11/12/2003  . Zoster 11/11/2005   Past Surgical History  Procedure Laterality Date  . Dilation and curettage of uterus  1994 & 1996   Family History  Problem  Relation Age of Onset  . Cancer Mother     Breast  . Ulcerative colitis Mother   . Arthritis Father   . Hypertension Father   . Macular degeneration Father    History  Substance Use Topics  . Smoking status: Never Smoker   . Smokeless tobacco: Not on file  . Alcohol Use: 7.0 oz/week    14 drink(s) per week    ROS Constitutional: Denies fever, chills, weight loss/gain, headaches, insomnia, fatigue, night sweats, and change in appetite. Eyes: Denies redness, blurred vision, diplopia, discharge, itchy, watery eyes.  ENT: Denies discharge, congestion, post nasal drip, epistaxis, sore throat, earache, hearing loss, dental pain,  Tinnitus, Vertigo, Sinus pain, snoring.  Cardio: Denies chest pain, palpitations, irregular heartbeat, syncope, dyspnea, diaphoresis, orthopnea, PND, claudication, edema Respiratory: denies cough, dyspnea, DOE, pleurisy, hoarseness, laryngitis, wheezing.  Gastrointestinal: Denies dysphagia, heartburn, reflux, water brash, pain, cramps, nausea, vomiting, bloating, diarrhea, constipation, hematemesis, melena, hematochezia, jaundice, hemorrhoids Genitourinary: Denies dysuria, frequency, urgency, nocturia, hesitancy, discharge, hematuria, flank pain Breast: Breast lumps, nipple discharge, bleeding.  Musculoskeletal: Denies arthralgia, myalgia, stiffness, Jt. Swelling, pain, limp, and strain/sprain. Denies falls. Skin: Denies puritis, rash, hives, warts, acne, eczema, changing in skin lesion Neuro: No weakness, tremor, incoordination, spasms, paresthesia, pain Psychiatric: Denies confusion, memory loss, sensory loss. Denies Depression. Endocrine: Denies change in weight, skin, hair change, nocturia, and paresthesia, diabetic polys, visual blurring, hyper / hypo glycemic episodes.  Heme/Lymph: No excessive bleeding, bruising, enlarged lymph nodes.  Physical Exam  BP 120/68 mmHg  Pulse 72  Temp(Src) 98 F (36.7 C)  Resp 16  Ht 5\' 6"  (1.676 m)  Wt 133 lb (60.328 kg)  BMI 21.48 kg/m2  General Appearance: Well nourished and in no apparent distress. Eyes: PERRLA, EOMs, conjunctiva no swelling or erythema, normal fundi and vessels. Sinuses: No frontal/maxillary tenderness ENT/Mouth: EACs patent / TMs  nl. Nares clear without erythema, swelling, mucoid exudates. Oral hygiene is good. No erythema, swelling, or exudate. Tongue normal, non-obstructing. Tonsils not swollen or erythematous. Hearing normal.  Neck: Supple, thyroid normal. No bruits, nodes or JVD. Respiratory: Respiratory effort normal.  BS equal and clear bilateral without rales, rhonci, wheezing or stridor. Cardio: Heart sounds are normal  with regular rate and rhythm and no murmurs, rubs or gallops. Peripheral pulses are normal and equal bilaterally without edema. No aortic or femoral bruits. Chest: symmetric with normal excursions and percussion. Breasts: Symmetric, without lumps, nipple discharge, retractions, or fibrocystic changes.  Abdomen: Flat, soft, with bowl sounds. Nontender, no guarding, rebound, hernias, masses, or organomegaly.  Lymphatics: Non tender without lymphadenopathy.  Genitourinary:  Musculoskeletal: Full ROM all peripheral extremities, joint stability, 5/5 strength, and normal gait. Skin: Warm and dry without rashes, lesions, cyanosis, clubbing or  ecchymosis.  Neuro: Cranial nerves intact, reflexes equal bilaterally. Normal muscle tone, no cerebellar symptoms. Sensation intact.  Pysch: Awake and oriented X 3, normal affect, Insight and Judgment appropriate.  Assessment and Plan  1. Annual Screening Examination 2. Elevated BP Screening  3. Hyperlipidemia Screening 4. Pre Diabetes Screening 5. Vitamin D Deficiency   Continue prudent diet as discussed, weight control, BP monitoring, regular exercise, and medications. Discussed med's effects and SE's. Screening labs and tests as requested with regular follow-up as recommended.

## 2014-09-29 LAB — MICROALBUMIN / CREATININE URINE RATIO: Creatinine, Urine: 9.4 mg/dL

## 2014-09-29 LAB — BASIC METABOLIC PANEL WITH GFR
BUN: 9 mg/dL (ref 6–23)
CHLORIDE: 99 meq/L (ref 96–112)
CO2: 29 meq/L (ref 19–32)
Calcium: 9.7 mg/dL (ref 8.4–10.5)
Creat: 0.58 mg/dL (ref 0.50–1.10)
GFR, Est Non African American: >89 mL/min
GLUCOSE: 91 mg/dL (ref 70–99)
Potassium: 4 mEq/L (ref 3.5–5.3)
SODIUM: 136 meq/L (ref 135–145)

## 2014-09-29 LAB — VITAMIN B12: VITAMIN B 12: 916 pg/mL — AB (ref 211–911)

## 2014-09-29 LAB — HEPATIC FUNCTION PANEL
ALT: 16 U/L (ref 0–35)
AST: 19 U/L (ref 0–37)
Albumin: 4.4 g/dL (ref 3.5–5.2)
Alkaline Phosphatase: 45 U/L (ref 39–117)
Bilirubin, Direct: 0.1 mg/dL (ref 0.0–0.3)
Indirect Bilirubin: 0.4 mg/dL (ref 0.2–1.2)
Total Bilirubin: 0.5 mg/dL (ref 0.2–1.2)
Total Protein: 6.8 g/dL (ref 6.0–8.3)

## 2014-09-29 LAB — LIPID PANEL
CHOL/HDL RATIO: 1.7 ratio
CHOLESTEROL: 166 mg/dL (ref 0–200)
HDL: 95 mg/dL (ref >39)
LDL Cholesterol: 51 mg/dL (ref 0–99)
Triglycerides: 98 mg/dL (ref <150)
VLDL: 20 mg/dL (ref 0–40)

## 2014-09-29 LAB — URINALYSIS, MICROSCOPIC ONLY
BACTERIA UA: NONE SEEN
CRYSTALS: NONE SEEN
Casts: NONE SEEN
Squamous Epithelial / LPF: NONE SEEN

## 2014-09-29 LAB — PROLACTIN: Prolactin: 52 ng/mL

## 2014-09-29 LAB — VITAMIN D 25 HYDROXY (VIT D DEFICIENCY, FRACTURES): Vit D, 25-Hydroxy: 46 ng/mL (ref 30–100)

## 2014-09-29 LAB — TSH: TSH: 3.57 u[IU]/mL (ref 0.350–4.500)

## 2014-09-29 LAB — INSULIN, FASTING: Insulin fasting, serum: 3 u[IU]/mL (ref 2.0–19.6)

## 2014-09-29 LAB — MAGNESIUM: Magnesium: 1.9 mg/dL (ref 1.5–2.5)

## 2014-10-01 ENCOUNTER — Encounter: Payer: Self-pay | Admitting: Internal Medicine

## 2014-10-01 ENCOUNTER — Other Ambulatory Visit: Payer: Self-pay | Admitting: Internal Medicine

## 2014-10-01 DIAGNOSIS — Z9109 Other allergy status, other than to drugs and biological substances: Secondary | ICD-10-CM

## 2014-10-01 DIAGNOSIS — K589 Irritable bowel syndrome without diarrhea: Secondary | ICD-10-CM

## 2014-10-01 DIAGNOSIS — L219 Seborrheic dermatitis, unspecified: Secondary | ICD-10-CM

## 2014-10-01 MED ORDER — HYOSCYAMINE SULFATE 0.125 MG PO TABS: 0.1250 mg | ORAL_TABLET | ORAL | Status: DC | PRN

## 2014-10-01 MED ORDER — SULFACETAMIDE SODIUM 10 % EX LOTN: TOPICAL_LOTION | CUTANEOUS | Status: DC

## 2014-10-01 MED ORDER — LORATADINE 10 MG PO TABS: 10.0000 mg | ORAL_TABLET | Freq: Every day | ORAL | Status: DC | PRN

## 2014-10-10 ENCOUNTER — Other Ambulatory Visit: Payer: Self-pay | Admitting: *Deleted

## 2014-10-10 MED ORDER — SULFACETAMIDE SODIUM 10 % EX SUSP
CUTANEOUS | Status: DC
Start: 2014-10-10 — End: 2016-10-02

## 2014-10-27 ENCOUNTER — Ambulatory Visit
Admission: RE | Admit: 2014-10-27 | Discharge: 2014-10-27 | Disposition: A | Discharge status: Home / Self Care | Payer: BC Managed Care – PPO | Source: Ambulatory Visit

## 2014-10-27 ENCOUNTER — Other Ambulatory Visit: Payer: BC Managed Care – PPO

## 2014-10-27 DIAGNOSIS — Z1231 Encounter for screening mammogram for malignant neoplasm of breast: Secondary | ICD-10-CM

## 2014-10-27 DIAGNOSIS — M8580 Other specified disorders of bone density and structure, unspecified site: Secondary | ICD-10-CM

## 2014-10-31 ENCOUNTER — Other Ambulatory Visit: Payer: BC Managed Care – PPO

## 2014-11-10 ENCOUNTER — Telehealth: Payer: Self-pay | Admitting: *Deleted

## 2014-11-10 MED ORDER — AZITHROMYCIN 250 MG PO TABS
ORAL_TABLET | ORAL | Status: DC
Start: 2014-11-10 — End: 2015-09-22

## 2014-11-10 MED ORDER — PROMETHAZINE-DM 6.25-15 MG/5ML PO SYRP: 5.0000 mL | ORAL_SOLUTION | Freq: Four times a day (QID) | ORAL | Status: DC | PRN

## 2014-11-10 NOTE — Telephone Encounter (Signed)
Patient called.  She has had a cold x 3 days, but no fever.  She also has an annoying tickle/cough and has tried Delsym.  OK to send in RX for Zpak and Phenergan DM.  Left message to inform patient.

## 2015-03-02 ENCOUNTER — Other Ambulatory Visit (HOSPITAL_COMMUNITY)
Admission: RE | Admit: 2015-03-02 | Discharge: 2015-03-02 | Disposition: A | Discharge status: Home / Self Care | Payer: 59 | Source: Ambulatory Visit | Attending: Obstetrics and Gynecology | Admitting: Obstetrics and Gynecology

## 2015-03-02 ENCOUNTER — Other Ambulatory Visit: Payer: Self-pay | Admitting: Obstetrics and Gynecology

## 2015-03-02 DIAGNOSIS — Z124 Encounter for screening for malignant neoplasm of cervix: Secondary | ICD-10-CM | POA: Diagnosis present

## 2015-03-03 LAB — CYTOLOGY - PAP

## 2015-09-18 ENCOUNTER — Other Ambulatory Visit: Payer: Self-pay

## 2015-09-18 DIAGNOSIS — Z1231 Encounter for screening mammogram for malignant neoplasm of breast: Secondary | ICD-10-CM

## 2015-09-22 ENCOUNTER — Encounter: Payer: Self-pay | Admitting: Internal Medicine

## 2015-09-22 ENCOUNTER — Ambulatory Visit (INDEPENDENT_AMBULATORY_CARE_PROVIDER_SITE_OTHER): Payer: 59 | Admitting: Internal Medicine

## 2015-09-22 VITALS — BP 116/72 | HR 82 | Temp 98.4°F | Resp 18 | Ht 66.0 in | Wt 129.0 lb

## 2015-09-22 DIAGNOSIS — R030 Elevated blood-pressure reading, without diagnosis of hypertension: Secondary | ICD-10-CM | POA: Diagnosis not present

## 2015-09-22 DIAGNOSIS — R5383 Other fatigue: Secondary | ICD-10-CM

## 2015-09-22 DIAGNOSIS — Z1212 Encounter for screening for malignant neoplasm of rectum: Secondary | ICD-10-CM

## 2015-09-22 DIAGNOSIS — Z0000 Encounter for general adult medical examination without abnormal findings: Secondary | ICD-10-CM

## 2015-09-22 DIAGNOSIS — E559 Vitamin D deficiency, unspecified: Secondary | ICD-10-CM

## 2015-09-22 DIAGNOSIS — Z79899 Other long term (current) drug therapy: Secondary | ICD-10-CM

## 2015-09-22 DIAGNOSIS — Z23 Encounter for immunization: Secondary | ICD-10-CM | POA: Diagnosis not present

## 2015-09-22 DIAGNOSIS — D497 Neoplasm of unspecified behavior of endocrine glands and other parts of nervous system: Secondary | ICD-10-CM

## 2015-09-22 DIAGNOSIS — E7800 Pure hypercholesterolemia, unspecified: Secondary | ICD-10-CM

## 2015-09-22 DIAGNOSIS — Z6820 Body mass index (BMI) 20.0-20.9, adult: Secondary | ICD-10-CM

## 2015-09-22 DIAGNOSIS — E538 Deficiency of other specified B group vitamins: Secondary | ICD-10-CM

## 2015-09-22 DIAGNOSIS — IMO0001 Reserved for inherently not codable concepts without codable children: Secondary | ICD-10-CM

## 2015-09-22 DIAGNOSIS — R7309 Other abnormal glucose: Secondary | ICD-10-CM

## 2015-09-22 DIAGNOSIS — K589 Irritable bowel syndrome without diarrhea: Secondary | ICD-10-CM

## 2015-09-22 DIAGNOSIS — Z0001 Encounter for general adult medical examination with abnormal findings: Secondary | ICD-10-CM

## 2015-09-22 LAB — HEPATIC FUNCTION PANEL
ALT: 14 U/L (ref 6–29)
AST: 19 U/L (ref 10–35)
Albumin: 4.4 g/dL (ref 3.6–5.1)
Alkaline Phosphatase: 49 U/L (ref 33–130)
Bilirubin, Direct: 0.1 mg/dL (ref <=0.2)
Indirect Bilirubin: 0.5 mg/dL (ref 0.2–1.2)
TOTAL PROTEIN: 6.9 g/dL (ref 6.1–8.1)
Total Bilirubin: 0.6 mg/dL (ref 0.2–1.2)

## 2015-09-22 LAB — CBC WITH DIFFERENTIAL/PLATELET
Basophils Absolute: 0 10*3/uL (ref 0.0–0.1)
Basophils Relative: 0 % (ref 0–1)
Eosinophils Absolute: 0.1 10*3/uL (ref 0.0–0.7)
Eosinophils Relative: 2 % (ref 0–5)
HCT: 38.4 % (ref 36.0–46.0)
Hemoglobin: 12.6 g/dL (ref 12.0–15.0)
LYMPHS ABS: 1.7 10*3/uL (ref 0.7–4.0)
Lymphocytes Relative: 26 % (ref 12–46)
MCH: 29 pg (ref 26.0–34.0)
MCHC: 32.8 g/dL (ref 30.0–36.0)
MCV: 88.5 fL (ref 78.0–100.0)
MONO ABS: 0.5 10*3/uL (ref 0.1–1.0)
MONOS PCT: 7 % (ref 3–12)
MPV: 8.7 fL (ref 8.6–12.4)
NEUTROS ABS: 4.4 10*3/uL (ref 1.7–7.7)
Neutrophils Relative %: 65 % (ref 43–77)
Platelets: 383 10*3/uL (ref 150–400)
RBC: 4.34 MIL/uL (ref 3.87–5.11)
RDW: 13.5 % (ref 11.5–15.5)
WBC: 6.7 10*3/uL (ref 4.0–10.5)

## 2015-09-22 LAB — BASIC METABOLIC PANEL WITH GFR
BUN: 12 mg/dL (ref 7–25)
CALCIUM: 9.6 mg/dL (ref 8.6–10.4)
CHLORIDE: 95 mmol/L — AB (ref 98–110)
CO2: 28 mmol/L (ref 20–31)
Creat: 0.66 mg/dL (ref 0.50–0.99)
GFR, Est African American: >89 mL/min (ref >=60)
GFR, Est Non African American: >89 mL/min (ref >=60)
GLUCOSE: 95 mg/dL (ref 65–99)
Potassium: 4 mmol/L (ref 3.5–5.3)
Sodium: 136 mmol/L (ref 135–146)

## 2015-09-22 LAB — MAGNESIUM: MAGNESIUM: 2 mg/dL (ref 1.5–2.5)

## 2015-09-22 LAB — HEMOGLOBIN A1C
Hgb A1c MFr Bld: 5.4 % (ref <5.7)
MEAN PLASMA GLUCOSE: 108 mg/dL (ref <117)

## 2015-09-22 LAB — LIPID PANEL
CHOLESTEROL: 165 mg/dL (ref 125–200)
HDL: 113 mg/dL (ref >=46)
LDL Cholesterol: 34 mg/dL (ref <130)
Total CHOL/HDL Ratio: 1.5 Ratio (ref <=5.0)
Triglycerides: 89 mg/dL (ref <150)
VLDL: 18 mg/dL (ref <30)

## 2015-09-22 LAB — IRON AND TIBC
%SAT: 28 % (ref 11–50)
Iron: 96 ug/dL (ref 45–160)
TIBC: 344 ug/dL (ref 250–450)
UIBC: 248 ug/dL (ref 125–400)

## 2015-09-22 NOTE — Patient Instructions (Signed)
Recommend Adult Low Dose Aspirin or   coated  Aspirin 81 mg daily   To reduce risk of Colon Cancer 20 %,   Skin Cancer 26 % ,   Melanoma 46%   and   Pancreatic cancer 60%   ++++++++++++++++++++++++++++++++++++++++++++++++++++++  Vitamin D goal   is between 70-100.   Please make sure that you are taking your Vitamin D as directed.   It is very important as a natural anti-inflammatory   helping hair, skin, and nails, as well as reducing stroke and heart attack risk.   It helps your bones and helps with mood.  It also decreases numerous cancer risks so please take it as directed.   Low Vit D is associated with a 200-300% higher risk for CANCER   and 200-300% higher risk for HEART   ATTACK  &  STROKE.   ......................................  It is also associated with higher death rate at younger ages,   autoimmune diseases like Rheumatoid arthritis, Lupus, Multiple Sclerosis.     Also many other serious conditions, like depression, Alzheimer's  Dementia, infertility, muscle aches, fatigue, fibromyalgia - just to name a few.  ++++++++++++++++++++++++++++++++++++++++++++++++  Recommend the book "The END of DIETING" by Dr Joel Fuhrman   & the book "The END of DIABETES " by Dr Joel Fuhrman  At [employer] - get book & Audio CD's     Being diabetic has a  300% increased risk for heart attack, stroke, cancer, and alzheimer- type vascular dementia. It is very important that you work harder with diet by avoiding all foods that are white. Avoid white rice (brown & wild rice is OK), white potatoes (sweetpotatoes in moderation is OK), White bread or wheat bread or anything made out of white flour like bagels, donuts, rolls, buns, biscuits, cakes, pastries, cookies, pizza crust, and pasta (made from white flour & egg whites) - vegetarian pasta or spinach or wheat pasta is OK. Multigrain breads like Arnold's or [employer], or multigrain sandwich thins or flatbreads.  Diet,  exercise and weight loss can reverse and cure diabetes in the early stages.  Diet, exercise and weight loss is very important in the control and prevention of complications of diabetes which affects every system in your body, ie. Brain - dementia/stroke, eyes - glaucoma/blindness, heart - heart attack/heart failure, kidneys - dialysis, stomach - gastric paralysis, intestines - malabsorption, nerves - severe painful neuritis, circulation - gangrene & loss of a leg(s), and finally cancer and Alzheimers.    I recommend avoid fried & greasy foods,  sweets/candy, white rice (brown or wild rice or Quinoa is OK), white potatoes (sweet potatoes are OK) - anything made from white flour - bagels, doughnuts, rolls, buns, biscuits,white and wheat breads, pizza crust and traditional pasta made of white flour & egg white(vegetarian pasta or spinach or wheat pasta is OK).  Multi-grain bread is OK - like multi-grain flat bread or sandwich thins. Avoid alcohol in excess. Exercise is also important.    Eat all the vegetables you want - avoid meat, especially red meat and dairy - especially cheese.  Cheese is the most concentrated form of trans-fats which is the worst thing to clog up our arteries. Veggie cheese is OK which can be found in the fresh produce section at [employer] or [employer] or [employer]  ++++++++++++++++++++++++++++++++++++++++++++++++++ DASH Eating Plan  DASH stands for "Dietary Approaches to Stop Hypertension."   The DASH eating plan is a healthy eating plan that has been shown to reduce high   blood pressure (hypertension). Additional health benefits may include reducing the risk of type 2 diabetes mellitus, heart disease, and stroke. The DASH eating plan may also help with weight loss.  WHAT DO I NEED TO KNOW ABOUT THE DASH EATING PLAN? For the DASH eating plan, you will follow these general guidelines:  Choose foods with a percent daily value for sodium of less than 5% (as listed on the food  label).  Use salt-free seasonings or herbs instead of table salt or sea salt.  Check with your health care provider or pharmacist before using salt substitutes.  Eat lower-sodium products, often labeled as "lower sodium" or "no salt added."  Eat fresh foods.  Eat more vegetables, fruits, and low-fat dairy products.    Choose whole grains. Look for the word "whole" as the first word in the ingredient list.  Choose fish   Limit sweets, desserts, sugars, and sugary drinks.  Choose heart-healthy fats.  Eat veggie cheese   Eat more home-cooked food and less restaurant, buffet, and fast food.  Limit fried foods.  Cook foods using methods other than frying.  Limit canned vegetables. If you do use them, rinse them well to decrease the sodium.  When eating at a restaurant, ask that your food be prepared with less salt, or no salt if possible.                      WHAT FOODS CAN I EAT?  Seek help from a dietitian for individual calorie needs. Grains Whole grain or whole wheat bread. Brown rice. Whole grain or whole wheat pasta. Quinoa, bulgur, and whole grain cereals. Low-sodium cereals. Corn or whole wheat flour tortillas. Whole grain cornbread. Whole grain crackers. Low-sodium crackers.  Vegetables Fresh or frozen vegetables (raw, steamed, roasted, or grilled). Low-sodium or reduced-sodium tomato and vegetable juices. Low-sodium or reduced-sodium tomato sauce and paste. Low-sodium or reduced-sodium canned vegetables.   Fruits All fresh, canned (in natural juice), or frozen fruits.  Meat and Other Protein Products  All fish and seafood.  Dried beans, peas, or lentils. Unsalted nuts and seeds. Unsalted canned beans. Dairy Low-fat dairy products, such as skim or 1% milk, 2% or reduced-fat cheeses, low-fat ricotta or cottage cheese, or plain low-fat yogurt. Low-sodium or reduced-sodium cheeses.  Fats and Oils Tub margarines without trans fats. Light or reduced-fat mayonnaise  and salad dressings (reduced sodium). Avocado. Safflower, olive, or canola oils. Natural peanut or almond butter.  Other Unsalted popcorn and pretzels. The items listed above may not be a complete list of recommended foods or beverages. Contact your dietitian for more options.  +++++++++++++++++++++++++++++++++++++++++++  WHAT FOODS ARE NOT RECOMMENDED?  Grains/ White flour or wheat flour  White bread. White pasta. White rice. Refined cornbread. Bagels and croissants. Crackers that contain trans fat.  Vegetables  Creamed or fried vegetables. Vegetables in a . Regular canned vegetables. Regular canned tomato sauce and paste. Regular tomato and vegetable juices.  Fruits Dried fruits. Canned fruit in light or heavy syrup. Fruit juice.  Meat and Other Protein Products Meat in general. Fatty cuts of meat. Ribs, chicken wings, bacon, sausage, bologna, salami, chitterlings, fatback, hot dogs, bratwurst, and packaged luncheon meats. Salted nuts and seeds. Canned beans with salt.  Dairy Whole or 2% milk, cream, half-and-half, and cream cheese. Whole-fat or sweetened yogurt. Full-fat cheeses or blue cheese. Nondairy creamers and whipped toppings. Processed cheese, cheese spreads, or cheese curds.  Condiments Onion and garlic salt, seasoned salt, table salt, and sea  salt. Canned and packaged gravies. Worcestershire sauce. Tartar sauce. Barbecue sauce. Teriyaki sauce. Soy sauce, including reduced sodium. Steak sauce. Fish sauce. Oyster sauce. Cocktail sauce. Horseradish. Ketchup and mustard. Meat flavorings and tenderizers. Bouillon cubes. Hot sauce. Tabasco sauce. Marinades. Taco seasonings. Relishes.  Fats and Oils Butter, stick margarine, lard, shortening, ghee, and bacon fat. Coconut, palm kernel, or palm oils. Regular salad dressings.  Pickles and olives. Salted popcorn and pretzels. The items listed above may not be a complete list of foods and beverages to avoid.   Preventive Care for  Adults  A healthy lifestyle and preventive care can promote health and wellness. Preventive health guidelines for women include the following key practices.  A routine yearly physical is a good way to check with your health care provider about your health and preventive screening. It is a chance to share any concerns and updates on your health and to receive a thorough exam.  Visit your dentist for a routine exam and preventive care every 6 months. Brush your teeth twice a day and floss once a day. Good oral hygiene prevents tooth decay and gum disease.  The frequency of eye exams is based on your age, health, family medical history, use of contact lenses, and other factors. Follow your health care provider's recommendations for frequency of eye exams.  Eat a healthy diet. Foods like vegetables, fruits, whole grains, low-fat dairy products, and lean protein foods contain the nutrients you need without too many calories. Decrease your intake of foods high in solid fats, added sugars, and salt. Eat the right amount of calories for you.Get information about a proper diet from your health care provider, if necessary.  Regular physical exercise is one of the most important things you can do for your health. Most adults should get at least 150 minutes of moderate-intensity exercise (any activity that increases your heart rate and causes you to sweat) each week. In addition, most adults need muscle-strengthening exercises on 2 or more days a week.  Maintain a healthy weight. The body mass index (BMI) is a screening tool to identify possible weight problems. It provides an estimate of body fat based on height and weight. Your health care provider can find your BMI and can help you achieve or maintain a healthy weight.For adults 20 years and older:  A BMI below 18.5 is considered underweight.  A BMI of 18.5 to 24.9 is normal.  A BMI of 25 to 29.9 is considered overweight.  A BMI of 30 and above is  considered obese.  Maintain normal blood lipids and cholesterol levels by exercising and minimizing your intake of saturated fat. Eat a balanced diet with plenty of fruit and vegetables. Blood tests for lipids and cholesterol should begin at age 36 and be repeated every 5 years. If your lipid or cholesterol levels are high, you are over 50, or you are at high risk for heart disease, you may need your cholesterol levels checked more frequently.Ongoing high lipid and cholesterol levels should be treated with medicines if diet and exercise are not working.  If you smoke, find out from your health care provider how to quit. If you do not use tobacco, do not start.  Lung cancer screening is recommended for adults aged 56-80 years who are at high risk for developing lung cancer because of a history of smoking. A yearly low-dose CT scan of the lungs is recommended for people who have at least a 30-pack-year history of smoking and are a current  smoker or have quit within the past 15 years. A pack year of smoking is smoking an average of 1 pack of cigarettes a day for 1 year (for example: 1 pack a day for 30 years or 2 packs a day for 15 years). Yearly screening should continue until the smoker has stopped smoking for at least 15 years. Yearly screening should be stopped for people who develop a health problem that would prevent them from having lung cancer treatment.  High blood pressure causes heart disease and increases the risk of stroke. Your blood pressure should be checked at least every 1 to 2 years. Ongoing high blood pressure should be treated with medicines if weight loss and exercise do not work.  If you are 72-74 years old, ask your health care provider if you should take aspirin to prevent strokes.  Diabetes screening involves taking a blood sample to check your fasting blood sugar level. This should be done once every 3 years, after age 5, if you are within normal weight and without risk factors  for diabetes. Testing should be considered at a younger age or be carried out more frequently if you are overweight and have at least 1 risk factor for diabetes.  Breast cancer screening is essential preventive care for women. You should practice "breast self-awareness." This means understanding the normal appearance and feel of your breasts and may include breast self-examination. Any changes detected, no matter how small, should be reported to a health care provider. Women in their 101s and 30s should have a clinical breast exam (CBE) by a health care provider as part of a regular health exam every 1 to 3 years. After age 62, women should have a CBE every year. Starting at age 61, women should consider having a mammogram (breast X-ray test) every year. Women who have a family history of breast cancer should talk to their health care provider about genetic screening. Women at a high risk of breast cancer should talk to their health care providers about having an MRI and a mammogram every year.  Breast cancer gene (BRCA)-related cancer risk assessment is recommended for women who have family members with BRCA-related cancers. BRCA-related cancers include breast, ovarian, tubal, and peritoneal cancers. Having family members with these cancers may be associated with an increased risk for harmful changes (mutations) in the breast cancer genes BRCA1 and BRCA2. Results of the assessment will determine the need for genetic counseling and BRCA1 and BRCA2 testing.  Routine pelvic exams to screen for cancer are no longer recommended for nonpregnant women who are considered low risk for cancer of the pelvic organs (ovaries, uterus, and vagina) and who do not have symptoms. Ask your health care provider if a screening pelvic exam is right for you.  If you have had past treatment for cervical cancer or a condition that could lead to cancer, you need Pap tests and screening for cancer for at least 20 years after your  treatment. If Pap tests have been discontinued, your risk factors (such as having a new sexual partner) need to be reassessed to determine if screening should be resumed. Some women have medical problems that increase the chance of getting cervical cancer. In these cases, your health care provider may recommend more frequent screening and Pap tests.  Colorectal cancer can be detected and often prevented. Most routine colorectal cancer screening begins at the age of 8 years and continues through age 30 years. However, your health care provider may recommend screening at an  earlier age if you have risk factors for colon cancer. On a yearly basis, your health care provider may provide home test kits to check for hidden blood in the stool. Use of a small camera at the end of a tube, to directly examine the colon (sigmoidoscopy or colonoscopy), can detect the earliest forms of colorectal cancer. Talk to your health care provider about this at age 45, when routine screening begins. Direct exam of the colon should be repeated every 5-10 years through age 65 years, unless early forms of pre-cancerous polyps or small growths are found.  Hepatitis C blood testing is recommended for all people born from [birth year] through 1965 and any individual with known risks for hepatitis C.  Pra  Osteoporosis is a disease in which the bones lose minerals and strength with aging. This can result in serious bone fractures or breaks. The risk of osteoporosis can be identified using a bone density scan. Women ages 48 years and over and women at risk for fractures or osteoporosis should discuss screening with their health care providers. Ask your health care provider whether you should take a calcium supplement or vitamin D to reduce the rate of osteoporosis.  Menopause can be associated with physical symptoms and risks. Hormone replacement therapy is available to decrease symptoms and risks. You should talk to your health care provider  about whether hormone replacement therapy is right for you.  Use sunscreen. Apply sunscreen liberally and repeatedly throughout the day. You should seek shade when your shadow is shorter than you. Protect yourself by wearing long sleeves, pants, a wide-brimmed hat, and sunglasses year round, whenever you are outdoors.  Once a month, do a whole body skin exam, using a mirror to look at the skin on your back. Tell your health care provider of new moles, moles that have irregular borders, moles that are larger than a pencil eraser, or moles that have changed in shape or color.  Stay current with required vaccines (immunizations).  Influenza vaccine. All adults should be immunized every year.  Tetanus, diphtheria, and acellular pertussis (Td, Tdap) vaccine. Pregnant women should receive 1 dose of Tdap vaccine during each pregnancy. The dose should be obtained regardless of the length of time since the last dose. Immunization is preferred during the 27th-36th week of gestation. An adult who has not previously received Tdap or who does not know her vaccine status should receive 1 dose of Tdap. This initial dose should be followed by tetanus and diphtheria toxoids (Td) booster doses every 10 years. Adults with an unknown or incomplete history of completing a 3-dose immunization series with Td-containing vaccines should begin or complete a primary immunization series including a Tdap dose. Adults should receive a Td booster every 10 years.  Varicella vaccine. An adult without evidence of immunity to varicella should receive 2 doses or a second dose if she has previously received 1 dose. Pregnant females who do not have evidence of immunity should receive the first dose after pregnancy. This first dose should be obtained before leaving the health care facility. The second dose should be obtained 4-8 weeks after the first dose.  Human papillomavirus (HPV) vaccine. Females aged 13-26 years who have not received  the vaccine previously should obtain the 3-dose series. The vaccine is not recommended for use in pregnant females. However, pregnancy testing is not needed before receiving a dose. If a female is found to be pregnant after receiving a dose, no treatment is needed. In that case, the remaining  doses should be delayed until after the pregnancy. Immunization is recommended for any person with an immunocompromised condition through the age of 61 years if she did not get any or all doses earlier. During the 3-dose series, the second dose should be obtained 4-8 weeks after the first dose. The third dose should be obtained 24 weeks after the first dose and 16 weeks after the second dose.  Zoster vaccine. One dose is recommended for adults aged 39 years or older unless certain conditions are present.  Measles, mumps, and rubella (MMR) vaccine. Adults born before [birth year] generally are considered immune to measles and mumps. Adults born in [birth year] or later should have 1 or more doses of MMR vaccine unless there is a contraindication to the vaccine or there is laboratory evidence of immunity to each of the three diseases. A routine second dose of MMR vaccine should be obtained at least 28 days after the first dose for students attending postsecondary schools, health care workers, or international travelers. People who received inactivated measles vaccine or an unknown type of measles vaccine during 1963-1967 should receive 2 doses of MMR vaccine. People who received inactivated mumps vaccine or an unknown type of mumps vaccine before 1979 and are at high risk for mumps infection should consider immunization with 2 doses of MMR vaccine. For females of childbearing age, rubella immunity should be determined. If there is no evidence of immunity, females who are not pregnant should be vaccinated. If there is no evidence of immunity, females who are pregnant should delay immunization until after pregnancy. Unvaccinated health care  workers born before [birth year] who lack laboratory evidence of measles, mumps, or rubella immunity or laboratory confirmation of disease should consider measles and mumps immunization with 2 doses of MMR vaccine or rubella immunization with 1 dose of MMR vaccine.  Pneumococcal 13-valent conjugate (PCV13) vaccine. When indicated, a person who is uncertain of her immunization history and has no record of immunization should receive the PCV13 vaccine. An adult aged 70 years or older who has certain medical conditions and has not been previously immunized should receive 1 dose of PCV13 vaccine. This PCV13 should be followed with a dose of pneumococcal polysaccharide (PPSV23) vaccine. The PPSV23 vaccine dose should be obtained at least 8 weeks after the dose of PCV13 vaccine. An adult aged 62 years or older who has certain medical conditions and previously received 1 or more doses of PPSV23 vaccine should receive 1 dose of PCV13. The PCV13 vaccine dose should be obtained 1 or more years after the last PPSV23 vaccine dose.    Pneumococcal polysaccharide (PPSV23) vaccine. When PCV13 is also indicated, PCV13 should be obtained first. All adults aged 60 years and older should be immunized. An adult younger than age 26 years who has certain medical conditions should be immunized. Any person who resides in a nursing home or long-term care facility should be immunized. An adult smoker should be immunized. People with an immunocompromised condition and certain other conditions should receive both PCV13 and PPSV23 vaccines. People with human immunodeficiency virus (HIV) infection should be immunized as soon as possible after diagnosis. Immunization during chemotherapy or radiation therapy should be avoided. Routine use of PPSV23 vaccine is not recommended for American Indians, [place] Natives, or people younger than 65 years unless there are medical conditions that require PPSV23 vaccine. When indicated, people who have unknown  immunization and have no record of immunization should receive PPSV23 vaccine. One-time revaccination 5 years after the first dose of PPSV23  is recommended for people aged 19-64 years who have chronic kidney failure, nephrotic syndrome, asplenia, or immunocompromised conditions. People who received 1-2 doses of PPSV23 before age 11 years should receive another dose of PPSV23 vaccine at age 42 years or later if at least 5 years have passed since the previous dose. Doses of PPSV23 are not needed for people immunized with PPSV23 at or after age 43 years.  Preventive Services / Frequency   Ages 63 to 62 years  Blood pressure check.  Lipid and cholesterol check.  Lung cancer screening. / Every year if you are aged 54-80 years and have a 30-pack-year history of smoking and currently smoke or have quit within the past 15 years. Yearly screening is stopped once you have quit smoking for at least 15 years or develop a health problem that would prevent you from having lung cancer treatment.  Clinical breast exam.** / Every year after age 34 years.  BRCA-related cancer risk assessment.** / For women who have family members with a BRCA-related cancer (breast, ovarian, tubal, or peritoneal cancers).  Mammogram.** / Every year beginning at age 49 years and continuing for as long as you are in good health. Consult with your health care provider.  Pap test.** / Every 3 years starting at age 54 years through age 108 or 44 years with a history of 3 consecutive normal Pap tests.  HPV screening.** / Every 3 years from ages 57 years through ages 77 to 59 years with a history of 3 consecutive normal Pap tests.  Fecal occult blood test (FOBT) of stool. / Every year beginning at age 101 years and continuing until age 52 years. You may not need to do this test if you get a colonoscopy every 10 years.  Flexible sigmoidoscopy or colonoscopy.** / Every 5 years for a flexible sigmoidoscopy or every 10 years for a  colonoscopy beginning at age 55 years and continuing until age 54 years.  Hepatitis C blood test.** / For all people born from [birth year] through 1963 and any individual with known risks for hepatitis C.  Skin self-exam. / Monthly.  Influenza vaccine. / Every year.  Tetanus, diphtheria, and acellular pertussis (Tdap/Td) vaccine.** / Consult your health care provider. Pregnant women should receive 1 dose of Tdap vaccine during each pregnancy. 1 dose of Td every 10 years.  Varicella vaccine.** / Consult your health care provider. Pregnant females who do not have evidence of immunity should receive the first dose after pregnancy.  Zoster vaccine.** / 1 dose for adults aged 27 years or older.  Pneumococcal 13-valent conjugate (PCV13) vaccine.** / Consult your health care provider.  Pneumococcal polysaccharide (PPSV23) vaccine.** / 1 to 2 doses if you smoke cigarettes or if you have certain conditions.  Meningococcal vaccine.** / Consult your health care provider.  Hepatitis A vaccine.** / Consult your health care provider.  Hepatitis B vaccine.** / Consult your health care provider. Screening for abdominal aortic aneurysm (AAA)  by ultrasound is recommended for people over 50 who have history of high blood pressure or who are current or former smokers.

## 2015-09-23 ENCOUNTER — Encounter: Payer: Self-pay | Admitting: Internal Medicine

## 2015-09-23 DIAGNOSIS — Z6820 Body mass index (BMI) 20.0-20.9, adult: Secondary | ICD-10-CM | POA: Insufficient documentation

## 2015-09-23 LAB — URINALYSIS, ROUTINE W REFLEX MICROSCOPIC
Bilirubin Urine: NEGATIVE
GLUCOSE, UA: NEGATIVE
Hgb urine dipstick: NEGATIVE
Ketones, ur: NEGATIVE
Leukocytes, UA: NEGATIVE
NITRITE: NEGATIVE
PROTEIN: NEGATIVE
Specific Gravity, Urine: 1.005 (ref 1.001–1.035)
pH: 7.5 (ref 5.0–8.0)

## 2015-09-23 LAB — VITAMIN D 25 HYDROXY (VIT D DEFICIENCY, FRACTURES): VIT D 25 HYDROXY: 50 ng/mL (ref 30–100)

## 2015-09-23 LAB — PROLACTIN: PROLACTIN: 46.5 ng/mL

## 2015-09-23 LAB — VITAMIN B12: Vitamin B-12: 804 pg/mL (ref 211–911)

## 2015-09-23 LAB — TSH: TSH: 3.189 u[IU]/mL (ref 0.350–4.500)

## 2015-09-23 LAB — INSULIN, RANDOM: Insulin: 4.1 u[IU]/mL (ref 2.0–19.6)

## 2015-09-23 NOTE — Progress Notes (Signed)
Patient ID: Kimberly Murphy, female   DOB: 1950-12-05, 64 y.o.   MRN: [id number] Annual Screening/Preventative Visit and Comprehensive Evaluation &  Examination     This very nice 64 y.o. MWF presents for presents for a Wellness/Preventative Visit & comprehensive evaluation and management of multiple medical co-morbidities.  Patient has been followed for labile HTN, Prediabetes, Hyperlipidemia  and Vitamin D Deficiency.     Patient has a very remote hx/o Galactorrhea and elevated Prolactin levels and a ? of a Pituitary Microadenoma and in 2010 Dr Ellene Route felt that her previous MRI's had been over interpreted and that she did not have a Pituitary Adenoma.       Patient has hx/o sl elevated BP 130/86 in 2006 and has been monitored expectantly since. Patient's BP has been controlled at home and patient denies any cardiac symptoms as chest pain, palpitations, shortness of breath, dizziness or ankle swelling. Today's BP: 116/72 mmHg      Patient's hyperlipidemia is controlled with diet and medications. Patient denies myalgias or other medication SE's. Current lipids are at goal with Cholesterol 165; HDL 113; LDL 34; Triglycerides 89.     Patient has prediabetes predating and patient denies reactive hypoglycemic symptoms, visual blurring, diabetic polys, or paresthesias. Current A1c is  5.4%.      Finally, patient has history of Vitamin D Deficiency and today's Vitamin D is 50.  Medication Sig  . acyclovir ointment 5 % Apply 1 application topically every 3 (three) hours.  Marland Kitchen aspirin 81 MG Take 81 mg by mouth daily.  Marland Kitchen CALCIUM CITRATE  Take by mouth.  Marland Kitchen VITAMIN D- 5000 UNITS  Take 2,500 Units by mouth daily.  . Cyanocobalamin (B-12) 1000 MCG SL Place under the tongue.  . hyoscyamine 0.125 MG  Take 1 tablet (0.125 mg total) by mouth as needed.  . loratadine (CLARITIN) 10 MG tab Take 1 tablet (10 mg total) by mouth daily as needed for allergies.  . Magnesium 250 MG TABS Take by mouth daily.  .  progesterone (PROMETRIUM) 200 MG capsule Take 200 mg by mouth.  . Sulfacetamide Sodium 10 % SUSP Apply 1 time daily as directed only.   Allergies  Allergen Reactions  . Pse Hcl-Dm Hbr-Guaifenesin Tan     Increased Heart rate  . Seldane [Terfenadine]     Rapid palpitations  . Shellfish Allergy     N/V, diarrhea   Past Medical History  Diagnosis Date  . IBS (irritable bowel syndrome)   . GERD (gastroesophageal reflux disease)   . Vitamin D deficiency   . B12 deficiency   . Allergy   . Galactorrhea     with elevated Prolactin levels  . Pituitary microadenoma ([place])   . Hemorrhoid   . Aphthous stomatitis   . HSV-1 (herpes simplex virus 1) infection   . Tennis elbow 2007    Right elbow  . Synovitis of finger 2003    Left ring finger   Health Maintenance  Topic Date Due  . INFLUENZA VACCINE  06/11/2016  . MAMMOGRAM  10/27/2016  . PAP SMEAR  03/01/2018  . COLONOSCOPY  06/11/2018  . TETANUS/TDAP  09/28/2024  . ZOSTAVAX  Completed  . Hepatitis C Screening  Completed  . HIV Screening  Completed   Immunization History  Administered Date(s) Administered  . DT 09/28/2014  . Influenza Split 09/28/2013, 09/28/2014  . Influenza, Seasonal, Injecte, Preservative Fre 09/22/2015  . PPD Test 09/28/2013  . Pneumococcal Conjugate-13 09/28/2014  . Pneumococcal-Unspecified 11/11/1998  . Td 11/12/2003  .  Zoster 11/11/2005   Past Surgical History  Procedure Laterality Date  . Dilation and curettage of uterus  1994 & 1996   Family History  Problem Relation Age of Onset  . Cancer Mother     Breast  . Ulcerative colitis Mother   . Arthritis Father   . Hypertension Father   . Macular degeneration Father    Social History  Substance Use Topics  . Smoking status: Never Smoker   . Smokeless tobacco: None  . Alcohol Use: 7.0 oz/week    14 drink(s) per week    ROS Constitutional: Denies fever, chills, weight loss/gain, headaches, insomnia,  night sweats, and change in appetite.  Does c/o fatigue. Eyes: Denies redness, blurred vision, diplopia, discharge, itchy, watery eyes.  ENT: Denies discharge, congestion, post nasal drip, epistaxis, sore throat, earache, hearing loss, dental pain, Tinnitus, Vertigo, Sinus pain, snoring.  Cardio: Denies chest pain, palpitations, irregular heartbeat, syncope, dyspnea, diaphoresis, orthopnea, PND, claudication, edema Respiratory: denies cough, dyspnea, DOE, pleurisy, hoarseness, laryngitis, wheezing.  Gastrointestinal: Denies dysphagia, heartburn, reflux, water brash, pain, cramps, nausea, vomiting, bloating, diarrhea, constipation, hematemesis, melena, hematochezia, jaundice, hemorrhoids Genitourinary: Denies dysuria, frequency, urgency, nocturia, hesitancy, discharge, hematuria, flank pain Breast: Breast lumps, nipple discharge, bleeding.  Musculoskeletal: Denies arthralgia, myalgia, stiffness, Jt. Swelling, pain, limp, and strain/sprain. Denies falls. Skin: Denies puritis, rash, hives, warts, acne, eczema, changing in skin lesion Neuro: No weakness, tremor, incoordination, spasms, paresthesia, pain Psychiatric: Denies confusion, memory loss, sensory loss. Denies Depression. Endocrine: Denies change in weight, skin, hair change, nocturia, and paresthesia, diabetic polys, visual blurring, hyper / hypo glycemic episodes.  Heme/Lymph: No excessive bleeding, bruising, enlarged lymph nodes.  Physical Exam  BP 116/72 mmHg  Pulse 82  Temp(Src) 98.4 F (36.9 C) (Temporal)  Resp 18  Ht 5\' 6"  (1.676 m)  Wt 129 lb (58.514 kg)  BMI 20.83 kg/m2  General Appearance: Well nourished and in no apparent distress. Eyes: PERRLA, EOMs, conjunctiva no swelling or erythema, normal fundi and vessels. Sinuses: No frontal/maxillary tenderness ENT/Mouth: EACs patent / TMs  nl. Nares clear without erythema, swelling, mucoid exudates. Oral hygiene is good. No erythema, swelling, or exudate. Tongue normal, non-obstructing. Tonsils not swollen or  erythematous. Hearing normal.  Neck: Supple, thyroid normal. No bruits, nodes or JVD. Respiratory: Respiratory effort normal.  BS equal and clear bilateral without rales, rhonci, wheezing or stridor. Cardio: Heart sounds are normal with regular rate and rhythm and no murmurs, rubs or gallops. Peripheral pulses are normal and equal bilaterally without edema. No aortic or femoral bruits. Chest: symmetric with normal excursions and percussion. Breasts: Symmetric, without lumps, nipple discharge, retractions, or fibrocystic changes.  Abdomen: Flat, soft, with bowl sounds. Nontender, no guarding, rebound, hernias, masses, or organomegaly.  Lymphatics: Non tender without lymphadenopathy.  Genitourinary: Def to GYN.  Musculoskeletal: Full ROM all peripheral extremities, joint stability, 5/5 strength, and normal gait. Skin: Warm and dry without rashes, lesions, cyanosis, clubbing or  ecchymosis.  Neuro: Cranial nerves intact, reflexes equal bilaterally. Normal muscle tone, no cerebellar symptoms. Sensation intact.  Pysch: Awake and oriented X 3, normal affect, Insight and Judgment appropriate.   Assessment and Plan  1. Annual Preventative Screening Examination  - EKG 12-Lead - POC Hemoccult Bld/Stl ( - Vitamin B12 - Iron and TIBC - Urinalysis, Routine w reflex microscopic  - CBC with Differential/Platelet - BASIC METABOLIC PANEL WITH GFR - Hepatic function panel - Magnesium - Lipid panel - TSH - Hemoglobin A1c - Insulin, random - Vit D  25 hydroxy  2. Elevated BP, hx  - EKG 12-Lead - TSH  3. Elevated cholesterol  - Lipid panel  4. Other abnormal glucose  - Hemoglobin A1c - Insulin, random  5. Vitamin D deficiency  - Vit D  25 hydroxy   6. IBS (irritable bowel syndrome)   7. B12 deficiency   8. Screening for rectal cancer  - POC Hemoccult Bld/Stl (3-  9. Other fatigue  - Vitamin B12 - Iron and TIBC - TSH  10. Medication management  - Urinalysis, Routine w  reflex microscopic  - CBC with Differential/Platelet - BASIC METABOLIC PANEL WITH GFR - Hepatic function panel - Magnesium  11. Pituitary tumor ([place])  - Prolactin  12. Need for prophylactic vaccination and inoculation against influenza  - Flu vaccine greater than or equal to 3yo preservative free IM  13. Body mass index (BMI) of 20.0-20.9 in adult   Continue prudent diet as discussed, weight control, BP monitoring, regular exercise, and medications. Discussed med's effects and SE's. Screening labs and tests as requested with regular follow-up as recommended.

## 2015-10-09 ENCOUNTER — Encounter: Payer: Self-pay | Admitting: Internal Medicine

## 2015-10-25 ENCOUNTER — Ambulatory Visit
Admission: RE | Admit: 2015-10-25 | Discharge: 2015-10-25 | Disposition: A | Discharge status: Home / Self Care | Payer: 59 | Source: Ambulatory Visit

## 2015-10-25 DIAGNOSIS — Z1231 Encounter for screening mammogram for malignant neoplasm of breast: Secondary | ICD-10-CM

## 2016-02-13 ENCOUNTER — Other Ambulatory Visit: Payer: Self-pay | Admitting: Obstetrics and Gynecology

## 2016-02-13 ENCOUNTER — Other Ambulatory Visit (HOSPITAL_COMMUNITY)
Admission: RE | Admit: 2016-02-13 | Discharge: 2016-02-13 | Disposition: A | Discharge status: Home / Self Care | Payer: BLUE CROSS/BLUE SHIELD | Source: Ambulatory Visit | Attending: Obstetrics and Gynecology | Admitting: Obstetrics and Gynecology

## 2016-02-13 DIAGNOSIS — Z1151 Encounter for screening for human papillomavirus (HPV): Secondary | ICD-10-CM | POA: Diagnosis present

## 2016-02-13 DIAGNOSIS — Z01419 Encounter for gynecological examination (general) (routine) without abnormal findings: Secondary | ICD-10-CM | POA: Insufficient documentation

## 2016-02-14 LAB — CYTOLOGY - PAP

## 2016-03-25 ENCOUNTER — Encounter: Payer: Self-pay | Admitting: Internal Medicine

## 2016-03-27 ENCOUNTER — Emergency Department (HOSPITAL_COMMUNITY): Payer: BLUE CROSS/BLUE SHIELD

## 2016-03-27 ENCOUNTER — Emergency Department (HOSPITAL_COMMUNITY)
Admission: EM | Admit: 2016-03-27 | Discharge: 2016-03-27 | Disposition: A | Discharge status: Home / Self Care | Payer: BLUE CROSS/BLUE SHIELD | Attending: Emergency Medicine | Admitting: Emergency Medicine

## 2016-03-27 ENCOUNTER — Encounter (HOSPITAL_COMMUNITY): Payer: Self-pay | Admitting: Emergency Medicine

## 2016-03-27 DIAGNOSIS — S0993XA Unspecified injury of face, initial encounter: Secondary | ICD-10-CM | POA: Diagnosis present

## 2016-03-27 DIAGNOSIS — K219 Gastro-esophageal reflux disease without esophagitis: Secondary | ICD-10-CM | POA: Insufficient documentation

## 2016-03-27 DIAGNOSIS — S0012XA Contusion of left eyelid and periocular area, initial encounter: Secondary | ICD-10-CM | POA: Insufficient documentation

## 2016-03-27 DIAGNOSIS — Y999 Unspecified external cause status: Secondary | ICD-10-CM | POA: Insufficient documentation

## 2016-03-27 DIAGNOSIS — Y9289 Other specified places as the place of occurrence of the external cause: Secondary | ICD-10-CM | POA: Diagnosis not present

## 2016-03-27 DIAGNOSIS — Y939 Activity, unspecified: Secondary | ICD-10-CM | POA: Insufficient documentation

## 2016-03-27 DIAGNOSIS — S0083XA Contusion of other part of head, initial encounter: Secondary | ICD-10-CM | POA: Diagnosis not present

## 2016-03-27 DIAGNOSIS — Z79899 Other long term (current) drug therapy: Secondary | ICD-10-CM | POA: Insufficient documentation

## 2016-03-27 DIAGNOSIS — S0512XA Contusion of eyeball and orbital tissues, left eye, initial encounter: Secondary | ICD-10-CM

## 2016-03-27 DIAGNOSIS — Z7982 Long term (current) use of aspirin: Secondary | ICD-10-CM | POA: Diagnosis not present

## 2016-03-27 NOTE — Discharge Instructions (Signed)
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
## 2016-03-27 NOTE — ED Notes (Signed)
PA at bedside to discuss plan of care

## 2016-03-27 NOTE — ED Notes (Signed)
Pt reports understanding of discharge information. No questions at time of discharge 
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
## 2016-03-27 NOTE — ED Notes (Signed)
Pt given sandwich and water after confirming with PA that pt could eat.
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
## 2016-05-16 ENCOUNTER — Ambulatory Visit (INDEPENDENT_AMBULATORY_CARE_PROVIDER_SITE_OTHER): Payer: BLUE CROSS/BLUE SHIELD | Admitting: Internal Medicine

## 2016-05-16 ENCOUNTER — Encounter: Payer: Self-pay | Admitting: Internal Medicine

## 2016-05-16 VITALS — BP 130/74 | HR 82 | Temp 98.2°F | Resp 18 | Ht 66.0 in

## 2016-05-16 DIAGNOSIS — L237 Allergic contact dermatitis due to plants, except food: Secondary | ICD-10-CM

## 2016-05-16 MED ORDER — TRIAMCINOLONE ACETONIDE 0.1 % EX CREA: 1.0000 "application " | TOPICAL_CREAM | Freq: Three times a day (TID) | CUTANEOUS | Status: DC

## 2016-05-16 MED ORDER — DEXAMETHASONE 0.75 MG PO TABS: ORAL_TABLET | ORAL | Status: DC

## 2016-05-16 NOTE — Progress Notes (Signed)
   Subjective:    Patient ID: Kimberly Murphy, female    DOB: July 03, 1951, 65 y.o.   MRN: [id number]  [name] This is a new problem. The current episode started in the past 7 days. The problem has been gradually worsening since onset. Location: Bilateral arms and feet. The rash is characterized by blistering, itchiness and redness. She was exposed to plant contact (She was working in the yard removing tree stumps). Pertinent negatives include no anorexia, cough, diarrhea, eye pain, facial edema, fatigue, fever, shortness of breath, sore throat or vomiting. Past treatments include nothing. The treatment provided no relief. Her past medical history is significant for varicella. There is no history of allergies, asthma or eczema.      Review of Systems  Constitutional: Negative for fever and fatigue.  HENT: Negative for sore throat.   Eyes: Negative for pain.  Respiratory: Negative for cough and shortness of breath.   Gastrointestinal: Negative for vomiting, diarrhea and anorexia.       Objective:   Physical Exam  Constitutional: She is oriented to person, place, and time. She appears well-developed and well-nourished. No distress.  HENT:  Head: Normocephalic.  Mouth/Throat: Oropharynx is clear and moist. No oropharyngeal exudate.  Eyes: Conjunctivae are normal. No scleral icterus.  Neck: Normal range of motion. Neck supple. No JVD present. No thyromegaly present.  Cardiovascular: Normal rate, regular rhythm, normal heart sounds and intact distal pulses.  Exam reveals no gallop and no friction rub.   No murmur heard. Pulmonary/Chest: Effort normal and breath sounds normal. No respiratory distress. She has no wheezes. She has no rales. She exhibits no tenderness.  Abdominal: Soft. Bowel sounds are normal. She exhibits no distension and no mass. There is no tenderness. There is no rebound and no guarding.  Musculoskeletal: Normal range of motion.  Lymphadenopathy:    She has no cervical  adenopathy.  Neurological: She is alert and oriented to person, place, and time.  Skin: Skin is warm and dry. Rash noted. Rash is vesicular. She is not diaphoretic.     Psychiatric: She has a normal mood and affect. Her behavior is normal. Judgment and thought content normal.  Nursing note and vitals reviewed.   Filed Vitals:   05/16/16 1427  BP: 130/74  Pulse: 82  Temp: 98.2 F (36.8 C)  Resp: 18          Assessment & Plan:    1. Poison ivy  - triamcinolone cream (KENALOG) 0.1 %; Apply 1 application topically 3 (three) times daily.  Dispense: 30 g; Refill: 0 - dexamethasone (DECADRON) 0.75 MG tablet; Please take 3 tablets with breakfast x 1 day, take 2 tablets for 4 days  Dispense: 11 tablet; Refill: 0

## 2016-05-16 NOTE — Patient Instructions (Signed)

## 2016-06-21 ENCOUNTER — Other Ambulatory Visit: Payer: Self-pay | Admitting: Internal Medicine

## 2016-06-21 MED ORDER — AZITHROMYCIN 250 MG PO TABS: ORAL_TABLET | ORAL | 0 refills | Status: DC

## 2016-06-21 MED ORDER — PREDNISONE 20 MG PO TABS: ORAL_TABLET | ORAL | 0 refills | Status: DC

## 2016-09-18 ENCOUNTER — Other Ambulatory Visit: Payer: Self-pay | Admitting: Internal Medicine

## 2016-09-18 ENCOUNTER — Other Ambulatory Visit: Payer: Self-pay | Admitting: Obstetrics and Gynecology

## 2016-09-18 DIAGNOSIS — E2839 Other primary ovarian failure: Secondary | ICD-10-CM

## 2016-09-18 DIAGNOSIS — Z1231 Encounter for screening mammogram for malignant neoplasm of breast: Secondary | ICD-10-CM

## 2016-09-19 ENCOUNTER — Other Ambulatory Visit: Payer: Self-pay | Admitting: Obstetrics and Gynecology

## 2016-09-19 DIAGNOSIS — Z1231 Encounter for screening mammogram for malignant neoplasm of breast: Secondary | ICD-10-CM

## 2016-09-27 ENCOUNTER — Ambulatory Visit (INDEPENDENT_AMBULATORY_CARE_PROVIDER_SITE_OTHER): Payer: BLUE CROSS/BLUE SHIELD | Admitting: Internal Medicine

## 2016-09-27 ENCOUNTER — Encounter: Payer: Self-pay | Admitting: Internal Medicine

## 2016-09-27 VITALS — BP 130/80 | HR 94 | Temp 97.4°F | Resp 14 | Ht 66.0 in | Wt 131.4 lb

## 2016-09-27 DIAGNOSIS — R030 Elevated blood-pressure reading, without diagnosis of hypertension: Secondary | ICD-10-CM | POA: Diagnosis not present

## 2016-09-27 DIAGNOSIS — Z136 Encounter for screening for cardiovascular disorders: Secondary | ICD-10-CM

## 2016-09-27 DIAGNOSIS — E559 Vitamin D deficiency, unspecified: Secondary | ICD-10-CM

## 2016-09-27 DIAGNOSIS — Z23 Encounter for immunization: Secondary | ICD-10-CM

## 2016-09-27 DIAGNOSIS — E7800 Pure hypercholesterolemia, unspecified: Secondary | ICD-10-CM

## 2016-09-27 DIAGNOSIS — Z0000 Encounter for general adult medical examination without abnormal findings: Secondary | ICD-10-CM | POA: Diagnosis not present

## 2016-09-27 DIAGNOSIS — E039 Hypothyroidism, unspecified: Secondary | ICD-10-CM

## 2016-09-27 DIAGNOSIS — Z79899 Other long term (current) drug therapy: Secondary | ICD-10-CM | POA: Diagnosis not present

## 2016-09-27 DIAGNOSIS — D497 Neoplasm of unspecified behavior of endocrine glands and other parts of nervous system: Secondary | ICD-10-CM

## 2016-09-27 DIAGNOSIS — R7309 Other abnormal glucose: Secondary | ICD-10-CM

## 2016-09-27 DIAGNOSIS — R5383 Other fatigue: Secondary | ICD-10-CM

## 2016-09-27 DIAGNOSIS — E538 Deficiency of other specified B group vitamins: Secondary | ICD-10-CM

## 2016-09-27 DIAGNOSIS — Z0001 Encounter for general adult medical examination with abnormal findings: Secondary | ICD-10-CM

## 2016-09-27 DIAGNOSIS — Z1212 Encounter for screening for malignant neoplasm of rectum: Secondary | ICD-10-CM

## 2016-09-27 LAB — BASIC METABOLIC PANEL WITH GFR
BUN: 13 mg/dL (ref 7–25)
CALCIUM: 9.4 mg/dL (ref 8.6–10.4)
CO2: 23 mmol/L (ref 20–31)
Chloride: 99 mmol/L (ref 98–110)
Creat: 0.57 mg/dL (ref 0.50–0.99)
GLUCOSE: 96 mg/dL (ref 65–99)
Potassium: 4 mmol/L (ref 3.5–5.3)
SODIUM: 136 mmol/L (ref 135–146)

## 2016-09-27 LAB — HEPATIC FUNCTION PANEL
ALT: 13 U/L (ref 6–29)
AST: 17 U/L (ref 10–35)
Albumin: 4.4 g/dL (ref 3.6–5.1)
Alkaline Phosphatase: 55 U/L (ref 33–130)
BILIRUBIN DIRECT: 0.1 mg/dL (ref <=0.2)
BILIRUBIN INDIRECT: 0.5 mg/dL (ref 0.2–1.2)
TOTAL PROTEIN: 6.8 g/dL (ref 6.1–8.1)
Total Bilirubin: 0.6 mg/dL (ref 0.2–1.2)

## 2016-09-27 LAB — CBC WITH DIFFERENTIAL/PLATELET
Basophils Absolute: 0 cells/uL (ref 0–200)
Basophils Relative: 0 %
Eosinophils Absolute: 126 cells/uL (ref 15–500)
Eosinophils Relative: 2 %
HCT: 39.1 % (ref 35.0–45.0)
Hemoglobin: 12.8 g/dL (ref 11.7–15.5)
LYMPHS PCT: 27 %
Lymphs Abs: 1701 cells/uL (ref 850–3900)
MCH: 29.6 pg (ref 27.0–33.0)
MCHC: 32.7 g/dL (ref 32.0–36.0)
MCV: 90.3 fL (ref 80.0–100.0)
MPV: 8.6 fL (ref 7.5–12.5)
Monocytes Absolute: 378 cells/uL (ref 200–950)
Monocytes Relative: 6 %
Neutro Abs: 4095 cells/uL (ref 1500–7800)
Neutrophils Relative %: 65 %
PLATELETS: 370 10*3/uL (ref 140–400)
RBC: 4.33 MIL/uL (ref 3.80–5.10)
RDW: 13.9 % (ref 11.0–15.0)
WBC: 6.3 10*3/uL (ref 3.8–10.8)

## 2016-09-27 LAB — LIPID PANEL
CHOLESTEROL: 164 mg/dL (ref <200)
HDL: 93 mg/dL (ref >50)
LDL Cholesterol: 51 mg/dL (ref <100)
TRIGLYCERIDES: 102 mg/dL (ref <150)
Total CHOL/HDL Ratio: 1.8 Ratio (ref <5.0)
VLDL: 20 mg/dL (ref <30)

## 2016-09-27 LAB — TSH: TSH: 4.58 mIU/L — ABNORMAL HIGH

## 2016-09-27 LAB — VITAMIN B12: Vitamin B-12: 846 pg/mL (ref 200–1100)

## 2016-09-27 LAB — HEMOGLOBIN A1C
Hgb A1c MFr Bld: 5.1 % (ref <5.7)
MEAN PLASMA GLUCOSE: 100 mg/dL

## 2016-09-27 NOTE — Patient Instructions (Signed)

## 2016-09-27 NOTE — Progress Notes (Signed)
[place] [facility] Unk [place]into, M.D.    Uvaldo Bristle. Silverio Lay, P.A.-C      Starlyn Skeans, P.A.-C  [facility]                [address] [place], [place] [id number] Telephone [phone] Telefax [phone]  Annual Screening/Preventative Visit & Comprehensive Evaluation &  Examination     This very nice 65 y.o. WF presents for a Screening/Preventative Visit & comprehensive evaluation and management of multiple medical co-morbidities.  Patient is screened for  Labile elevated BP, abnormal lipids, glucose and Vitamin D Deficiency.     Patient has a very remote hx/o Galactorrhea with elevated Prolactin levels and a ? of a Pituitary Microadenoma and in 2010 Dr Ellene Route felt that her previous MRI's had been over interpreted and that she did not have a Pituitary Adenoma.   She has had VF's monitored in the past by Dr Claudean Kinds.       in 2016, patient did have a borderline elevated BP of 130/86.  Patient's BP has been controlled at home and patient denies any cardiac symptoms as chest pain, palpitations, shortness of breath, dizziness or ankle swelling. Today's BP is at goal at 130/80.     Patient lipids are controlled with diet.  Last lipids were at goal: Lab Results  Component Value Date   CHOL 165 09/22/2015   HDL 113 09/22/2015   LDLCALC 34 09/22/2015   TRIG 89 09/22/2015   CHOLHDL 1.5 09/22/2015      Patient has prediabetes predating since      and patient denies reactive hypoglycemic symptoms, visual blurring, diabetic polys, or paresthesias. Last A1c was at goal: Lab Results  Component Value Date   HGBA1C 5.4 09/22/2015      Finally, patient has history of Vitamin D Deficiency and last Vitamin D was low: Lab Results  Component Value Date   VD25OH 50 09/22/2015   Current Outpatient Prescriptions on File Prior to Visit  Medication Sig  . aspirin 81 MG tablet Take 81 mg by mouth daily.  Marland Kitchen CALCIUM  CITRATE PO Take 1 tablet by mouth daily.   . Cholecalciferol (VITAMIN D-3) 5000 UNITS TABS Take 2,500 Units by mouth daily.  . Cyanocobalamin (B-12) 1000 MCG SUBL Place 1,000 mcg under the tongue daily.   Marland Kitchen estradiol (VIVELLE-DOT) 0.0375 MG/24HR Place 1 patch onto the skin 2 (two) times a week.  . loratadine (CLARITIN) 10 MG tablet Take 1 tablet (10 mg total) by mouth daily as needed for allergies.  . Magnesium 250 MG TABS Take 250 mg by mouth daily.   . progesterone (PROMETRIUM) 200 MG capsule Take 200 mg by mouth daily. Takes for 12 days at the beginning of Every Other Month  . Sulfacetamide Sodium 10 % SUSP Apply 1 time daily as directed only.  . triamcinolone cream (KENALOG) 0.1 % Apply 1 application topically 3 (three) times daily.   No current facility-administered medications on file prior to visit.    Allergies  Allergen Reactions  . Pse Hcl-Dm Hbr-Guaifenesin Tan     Increased Heart rate  . Seldane [Terfenadine]     Rapid palpitations  . Shellfish Allergy     N/V, diarrhea   Past Medical History:  Diagnosis Date  . Allergy   . Aphthous stomatitis   . B12 deficiency   . Galactorrhea    with elevated Prolactin  levels  . GERD (gastroesophageal reflux disease)   . Hemorrhoid   . HSV-1 (herpes simplex virus 1) infection   . IBS (irritable bowel syndrome)   . Pituitary microadenoma ([place])   . Synovitis of finger 2003   Left ring finger  . Tennis elbow 2007   Right elbow  . Vitamin D deficiency    Health Maintenance  Topic Date Due  . INFLUENZA VACCINE  06/11/2016  . MAMMOGRAM  10/24/2017  . COLONOSCOPY  06/21/2018  . PAP SMEAR  02/13/2019  . TETANUS/TDAP  09/28/2024  . ZOSTAVAX  Completed  . Hepatitis C Screening  Completed  . HIV Screening  Completed   Immunization History  Administered Date(s) Administered  . DT 09/28/2014  . Influenza Split 09/28/2013, 09/28/2014  . Influenza, Seasonal, Injecte, Preservative Fre 09/22/2015  . PPD Test 09/28/2013  .  Pneumococcal Conjugate-13 09/28/2014  . Pneumococcal-Unspecified 11/11/1998  . Td 11/12/2003  . Zoster 11/11/2005   Past Surgical History:  Procedure Laterality Date  . DILATION AND CURETTAGE OF UTERUS  1994 & 1996   Family History  Problem Relation Age of Onset  . Cancer Mother     Breast  . Ulcerative colitis Mother   . Arthritis Father   . Hypertension Father   . Macular degeneration Father    Social History  Substance Use Topics  . Smoking status: Never Smoker  . Smokeless tobacco: Not on file  . Alcohol use 7.0 oz/week    14 drink(s) per week    ROS Constitutional: Denies fever, chills, weight loss/gain, headaches, insomnia,  night sweats, and change in appetite. Does c/o fatigue. Eyes: Denies redness, blurred vision, diplopia, discharge, itchy, watery eyes.  ENT: Denies discharge, congestion, post nasal drip, epistaxis, sore throat, earache, hearing loss, dental pain, Tinnitus, Vertigo, Sinus pain, snoring.  Cardio: Denies chest pain, palpitations, irregular heartbeat, syncope, dyspnea, diaphoresis, orthopnea, PND, claudication, edema Respiratory: denies cough, dyspnea, DOE, pleurisy, hoarseness, laryngitis, wheezing.  Gastrointestinal: Denies dysphagia, heartburn, reflux, water brash, pain, cramps, nausea, vomiting, bloating, diarrhea, constipation, hematemesis, melena, hematochezia, jaundice, hemorrhoids Genitourinary: Denies dysuria, frequency, urgency, nocturia, hesitancy, discharge, hematuria, flank pain Breast: Breast lumps, nipple discharge, bleeding.  Musculoskeletal: Denies arthralgia, myalgia, stiffness, Jt. Swelling, pain, limp, and strain/sprain. Denies falls. Skin: Denies puritis, rash, hives, warts, acne, eczema, changing in skin lesion Neuro: No weakness, tremor, incoordination, spasms, paresthesia, pain Psychiatric: Denies confusion, memory loss, sensory loss. Denies Depression. Endocrine: Denies change in weight, skin, hair change, nocturia, and  paresthesia, diabetic polys, visual blurring, hyper / hypo glycemic episodes.  Heme/Lymph: No excessive bleeding, bruising, enlarged lymph nodes.  Physical Exam  BP 130/80   Pulse 94   Temp 97.4 F (36.3 C)   Resp 14   Ht 5\' 6"  (1.676 m)   Wt 131 lb 6.4 oz (59.6 kg)   SpO2 97%   BMI 21.21 kg/m   General Appearance: Well nourished and in no apparent distress.  Eyes: PERRLA, EOMs, conjunctiva no swelling or erythema, normal fundi and vessels. Sinuses: No frontal/maxillary tenderness ENT/Mouth: EACs patent / TMs  nl. Nares clear without erythema, swelling, mucoid exudates. Oral hygiene is good. No erythema, swelling, or exudate. Tongue normal, non-obstructing. Tonsils not swollen or erythematous. Hearing normal.  Neck: Supple, thyroid normal. No bruits, nodes or JVD. Respiratory: Respiratory effort normal.  BS equal and clear bilateral without rales, rhonci, wheezing or stridor. Cardio: Heart sounds are normal with regular rate and rhythm and no murmurs, rubs or gallops. Peripheral pulses are normal and equal bilaterally  without edema. No aortic or femoral bruits. Chest: symmetric with normal excursions and percussion. Breasts:  Def to Gyn. Abdomen: Flat, soft with bowel sounds active. Nontender, no guarding, rebound, hernias, masses, or organomegaly.  Lymphatics: Non tender without lymphadenopathy.  Genitourinary: Def to GYN. Musculoskeletal: Full ROM all peripheral extremities, joint stability, 5/5 strength, and normal gait. Skin: Warm and dry without rashes, lesions, cyanosis, clubbing or  ecchymosis.  Neuro: Cranial nerves intact, reflexes equal bilaterally. Normal muscle tone, no cerebellar symptoms. Sensation intact.  Pysch: Alert and oriented X 3, normal affect, Insight and Judgment appropriate.   Assessment and Plan  1. Annual Preventative Screening Examination  - EKG 12-Lead - Microalbumin / creatinine urine ratio - [place], RETROPERITNL ABD,  LTD - POC Hemoccult Bld/Stl  -  Urinalysis, Routine w reflex microscopic  - Iron and TIBC - CBC with Differential/Platelet - BASIC METABOLIC PANEL WITH GFR - Hepatic function panel - Magnesium - Lipid panel - TSH - Hemoglobin A1c - Insulin, random - VITAMIN D 25 Hydroxy  - Prolactin  2. Elevated BP without diagnosis of hypertension  - EKG 12-Lead - Microalbumin / creatinine urine ratio - [place], RETROPERITNL ABD,  LTD - TSH  3. Other abnormal glucose  - Hemoglobin A1c - Insulin, random  4. Elevated cholesterol  - Lipid panel - TSH  5. Vitamin D deficiency  - VITAMIN D 25 Hydroxy  6. B12 deficiency   7. Screening for rectal cancer  - POC Hemoccult Bld/Stl (3-Cd Home Screen); Future  8. Pituitary tumor  - Prolactin  9. Screening for ischemic heart disease   10. Screening for AAA (aortic abdominal aneurysm)   11. Other fatigue  - Vitamin B12 - Iron and TIBC - CBC with Differential/Platelet - TSH  12. Flu vaccine need  - Flu vaccine 6-40mo preservative free IM  13. Medication management  - Urinalysis, Routine w reflex microscopic  - CBC with Differential/Platelet - BASIC METABOLIC PANEL WITH GFR - Hepatic function panel - Magnesium      Continue prudent diet as discussed, weight control, BP monitoring, regular exercise, and medications. Discussed med's effects and SE's. Screening labs and tests as requested with regular follow-up as recommended. Over 40 minutes of exam, counseling, chart review and high complex critical decision making was performed.

## 2016-09-28 LAB — IRON AND TIBC
%SAT: 30 % (ref 11–50)
Iron: 115 ug/dL (ref 45–160)
TIBC: 383 ug/dL (ref 250–450)
UIBC: 268 ug/dL (ref 125–400)

## 2016-09-28 LAB — URINALYSIS, ROUTINE W REFLEX MICROSCOPIC
BILIRUBIN URINE: NEGATIVE
GLUCOSE, UA: NEGATIVE
HGB URINE DIPSTICK: NEGATIVE
Ketones, ur: NEGATIVE
LEUKOCYTES UA: NEGATIVE
Nitrite: NEGATIVE
PH: 7 (ref 5.0–8.0)
Protein, ur: NEGATIVE
Specific Gravity, Urine: 1.005 (ref 1.001–1.035)

## 2016-09-28 LAB — MICROALBUMIN / CREATININE URINE RATIO: Creatinine, Urine: 10 mg/dL — ABNORMAL LOW (ref 20–320)

## 2016-09-28 LAB — VITAMIN D 25 HYDROXY (VIT D DEFICIENCY, FRACTURES): Vit D, 25-Hydroxy: 51 ng/mL (ref 30–100)

## 2016-09-28 LAB — PROLACTIN: PROLACTIN: 43.6 ng/mL — AB

## 2016-09-28 LAB — INSULIN, RANDOM: Insulin: 3.5 u[IU]/mL (ref 2.0–19.6)

## 2016-09-28 LAB — MAGNESIUM: Magnesium: 1.9 mg/dL (ref 1.5–2.5)

## 2016-10-02 ENCOUNTER — Other Ambulatory Visit: Payer: Self-pay | Admitting: *Deleted

## 2016-10-02 MED ORDER — SULFACETAMIDE SODIUM 10 % EX SUSP: CUTANEOUS | 11 refills | Status: DC

## 2016-10-22 ENCOUNTER — Ambulatory Visit
Admission: RE | Admit: 2016-10-22 | Discharge: 2016-10-22 | Disposition: A | Discharge status: Home / Self Care | Payer: BLUE CROSS/BLUE SHIELD | Source: Ambulatory Visit | Attending: Obstetrics and Gynecology | Admitting: Obstetrics and Gynecology

## 2016-10-22 ENCOUNTER — Ambulatory Visit
Admission: RE | Admit: 2016-10-22 | Discharge: 2016-10-22 | Disposition: A | Discharge status: Home / Self Care | Payer: BLUE CROSS/BLUE SHIELD | Source: Ambulatory Visit | Attending: Internal Medicine | Admitting: Internal Medicine

## 2016-10-22 DIAGNOSIS — Z1231 Encounter for screening mammogram for malignant neoplasm of breast: Secondary | ICD-10-CM

## 2016-10-22 DIAGNOSIS — E2839 Other primary ovarian failure: Secondary | ICD-10-CM

## 2016-10-24 ENCOUNTER — Ambulatory Visit: Payer: BLUE CROSS/BLUE SHIELD

## 2016-10-25 ENCOUNTER — Ambulatory Visit: Payer: BLUE CROSS/BLUE SHIELD

## 2016-10-28 ENCOUNTER — Encounter: Payer: Self-pay | Admitting: Internal Medicine

## 2016-10-29 ENCOUNTER — Other Ambulatory Visit: Payer: BLUE CROSS/BLUE SHIELD

## 2016-10-29 ENCOUNTER — Other Ambulatory Visit: Payer: Self-pay | Admitting: Internal Medicine

## 2016-10-29 DIAGNOSIS — I10 Essential (primary) hypertension: Secondary | ICD-10-CM | POA: Diagnosis not present

## 2016-10-29 DIAGNOSIS — E039 Hypothyroidism, unspecified: Secondary | ICD-10-CM

## 2016-10-29 LAB — TSH: TSH: 3.69 mIU/L

## 2017-02-13 ENCOUNTER — Other Ambulatory Visit: Payer: Self-pay | Admitting: Obstetrics and Gynecology

## 2017-02-13 ENCOUNTER — Other Ambulatory Visit (HOSPITAL_COMMUNITY)
Admission: RE | Admit: 2017-02-13 | Discharge: 2017-02-13 | Disposition: A | Discharge status: Home / Self Care | Payer: BLUE CROSS/BLUE SHIELD | Source: Ambulatory Visit | Attending: Obstetrics and Gynecology | Admitting: Obstetrics and Gynecology

## 2017-02-13 DIAGNOSIS — Z1151 Encounter for screening for human papillomavirus (HPV): Secondary | ICD-10-CM | POA: Diagnosis present

## 2017-02-13 DIAGNOSIS — Z01419 Encounter for gynecological examination (general) (routine) without abnormal findings: Secondary | ICD-10-CM | POA: Diagnosis present

## 2017-02-18 LAB — CYTOLOGY - PAP
Diagnosis: NEGATIVE
HPV: NOT DETECTED

## 2017-08-14 ENCOUNTER — Encounter: Payer: Self-pay | Admitting: Internal Medicine

## 2017-08-14 ENCOUNTER — Ambulatory Visit (INDEPENDENT_AMBULATORY_CARE_PROVIDER_SITE_OTHER): Payer: BLUE CROSS/BLUE SHIELD | Admitting: Internal Medicine

## 2017-08-14 VITALS — BP 124/76 | HR 72 | Temp 97.5°F | Resp 16 | Ht 66.0 in | Wt 129.6 lb

## 2017-08-14 DIAGNOSIS — Z136 Encounter for screening for cardiovascular disorders: Secondary | ICD-10-CM | POA: Diagnosis not present

## 2017-08-14 DIAGNOSIS — Z23 Encounter for immunization: Secondary | ICD-10-CM | POA: Diagnosis not present

## 2017-08-14 DIAGNOSIS — Z0001 Encounter for general adult medical examination with abnormal findings: Secondary | ICD-10-CM

## 2017-08-14 DIAGNOSIS — E559 Vitamin D deficiency, unspecified: Secondary | ICD-10-CM

## 2017-08-14 DIAGNOSIS — I10 Essential (primary) hypertension: Secondary | ICD-10-CM

## 2017-08-14 DIAGNOSIS — R5383 Other fatigue: Secondary | ICD-10-CM

## 2017-08-14 DIAGNOSIS — E7800 Pure hypercholesterolemia, unspecified: Secondary | ICD-10-CM

## 2017-08-14 DIAGNOSIS — Z0000 Encounter for general adult medical examination without abnormal findings: Secondary | ICD-10-CM

## 2017-08-14 DIAGNOSIS — Z1212 Encounter for screening for malignant neoplasm of rectum: Secondary | ICD-10-CM

## 2017-08-14 DIAGNOSIS — Z111 Encounter for screening for respiratory tuberculosis: Secondary | ICD-10-CM | POA: Diagnosis not present

## 2017-08-14 DIAGNOSIS — Z79899 Other long term (current) drug therapy: Secondary | ICD-10-CM

## 2017-08-14 DIAGNOSIS — R030 Elevated blood-pressure reading, without diagnosis of hypertension: Secondary | ICD-10-CM

## 2017-08-14 DIAGNOSIS — R7309 Other abnormal glucose: Secondary | ICD-10-CM

## 2017-08-14 DIAGNOSIS — Z1211 Encounter for screening for malignant neoplasm of colon: Secondary | ICD-10-CM

## 2017-08-14 NOTE — Progress Notes (Signed)
[place] [facility] Unk Pinto, M.D.     Uvaldo Bristle. Silverio Lay, P.A.-C Liane Comber, [place] [address] [place] [address] Telephone [phone] Telefax [phone] Annual Screening/Preventative Visit & Comprehensive Evaluation &  Examination     This very nice 66 y.o. MWF presents for a Screening/Preventative Visit & comprehensive evaluation and management of multiple medical co-morbidities.  Patient has been followed and screened for labile HTN, Prediabetes, Hyperlipidemia and Vitamin D Deficiency.                                       Patient has a very remote hx/o Galactorrhea and elevated Prolactin levels and a ? of a Pituitary Microadenoma and in 2010 Dr Ellene Route felt that her previous MRI's had been over interpreted and that she did not have a Pituitary Adenoma. She had annual Visual Field screening by Dr Claudean Kinds for years.       Patient has hx/o one remotely slightly elevated BP of 130/86 in 2016. Patient's BP has been controlled at home and patient denies any cardiac symptoms as chest pain, palpitations, shortness of breath, dizziness or ankle swelling. Today's BP is at goal - 124/76.      Patient's lipids are controlled with diet. Last lipids were at goal: Lab Results  Component Value Date   CHOL 169 08/14/2017   HDL 116 08/14/2017   LDLCALC 51 09/27/2016   TRIG 87 08/14/2017   CHOLHDL 1.5 08/14/2017      Patient has is monitored expectantly for prediabetes and patient denies reactive hypoglycemic symptoms, visual blurring, diabetic polys, or paresthesias. Last A1c was at goal: Lab Results  Component Value Date   HGBA1C 5.1 09/27/2016      Finally, patient has history of Vitamin D Deficiency and last Vitamin D was near goal (70-100): Lab Results  Component Value Date   VD25OH 51 09/27/2016   Current Outpatient Prescriptions on File Prior to Visit  Medication Sig  . aspirin 81 MG  tablet Take 81 mg by mouth daily.  Marland Kitchen CALCIUM CITRATE PO Take 1 tablet by mouth daily.   . Cholecalciferol (VITAMIN D-3) 5000 UNITS TABS Take 2,500 Units by mouth daily.  . Cyanocobalamin (B-12) 1000 MCG SUBL Place 1,000 mcg under the tongue daily.   Marland Kitchen estradiol (VIVELLE-DOT) 0.0375 MG/24HR Place 1 patch onto the skin 2 (two) times a week.  . loratadine (CLARITIN) 10 MG tablet Take 1 tablet (10 mg total) by mouth daily as needed for allergies.  . Magnesium 250 MG TABS Take 250 mg by mouth daily.   . progesterone (PROMETRIUM) 200 MG capsule Take 200 mg by mouth daily. Takes for 12 days at the beginning of Every Other Month  . Sulfacetamide Sodium 10 % SUSP Apply 1 time daily as directed only.   No current facility-administered medications on file prior to visit.    Allergies  Allergen Reactions  . Pseudoephedrine-Dm-Gg     Increased Heart rate  . Seldane [Terfenadine]     Rapid palpitations  . Shellfish Allergy     N/V, diarrhea   Past Medical History:  Diagnosis Date  . Allergy   . Aphthous stomatitis   . B12 deficiency   . Galactorrhea    with elevated Prolactin levels  . GERD (gastroesophageal reflux disease)   . Hemorrhoid   . HSV-1 (herpes simplex virus 1) infection   .  IBS (irritable bowel syndrome)   . Pituitary microadenoma ([place])   . Synovitis of finger 2003   Left ring finger  . Tennis elbow 2007   Right elbow  . Vitamin D deficiency    Health Maintenance  Topic Date Due  . INFLUENZA VACCINE  06/11/2017  . COLONOSCOPY  06/21/2018  . MAMMOGRAM  10/22/2018  . PAP SMEAR  02/14/2020  . TETANUS/TDAP  09/28/2024  . Hepatitis C Screening  Completed  . HIV Screening  Completed   Immunization History  Administered Date(s) Administered  . DT 09/28/2014  . Influenza Inj Mdck Quad With Preservative 08/14/2017  . Influenza Split 09/28/2013, 09/28/2014  . Influenza, Seasonal, Injecte, Preservative Fre 09/22/2015  . Influenza,inj,quad, With Preservative 09/27/2016  .  PPD Test 09/28/2013, 08/14/2017  . Pneumococcal Conjugate-13 09/28/2014  . Pneumococcal-Unspecified 11/11/1998  . Td 11/12/2003  . Zoster 11/11/2005   Past Surgical History:  Procedure Laterality Date  . DILATION AND CURETTAGE OF UTERUS  1994 & 1996   Family History  Problem Relation Age of Onset  . Cancer Mother        Breast  . Ulcerative colitis Mother   . Arthritis Father   . Hypertension Father   . Macular degeneration Father    Social History  Substance Use Topics  . Smoking status: Never Smoker  . Smokeless tobacco: Not on file  . Alcohol use 7.0 oz/week    14 drink(s) per week    ROS Constitutional: Denies fever, chills, weight loss/gain, headaches, insomnia,  night sweats, and change in appetite. Does c/o fatigue. Eyes: Denies redness, blurred vision, diplopia, discharge, itchy, watery eyes.  ENT: Denies discharge, congestion, post nasal drip, epistaxis, sore throat, earache, hearing loss, dental pain, Tinnitus, Vertigo, Sinus pain, snoring.  Cardio: Denies chest pain, palpitations, irregular heartbeat, syncope, dyspnea, diaphoresis, orthopnea, PND, claudication, edema Respiratory: denies cough, dyspnea, DOE, pleurisy, hoarseness, laryngitis, wheezing.  Gastrointestinal: Denies dysphagia, heartburn, reflux, water brash, pain, cramps, nausea, vomiting, bloating, diarrhea, constipation, hematemesis, melena, hematochezia, jaundice, hemorrhoids Genitourinary: Denies dysuria, frequency, urgency, nocturia, hesitancy, discharge, hematuria, flank pain Breast: Breast lumps, nipple discharge, bleeding.  Musculoskeletal: Denies arthralgia, myalgia, stiffness, Jt. Swelling, pain, limp, and strain/sprain. Denies falls. Skin: Denies puritis, rash, hives, warts, acne, eczema, changing in skin lesion Neuro: No weakness, tremor, incoordination, spasms, paresthesia, pain Psychiatric: Denies confusion, memory loss, sensory loss. Denies Depression. Endocrine: Denies change in weight,  skin, hair change, nocturia, and paresthesia, diabetic polys, visual blurring, hyper / hypo glycemic episodes.  Heme/Lymph: No excessive bleeding, bruising, enlarged lymph nodes.  Physical Exam  BP 124/76   Pulse 72   Temp (!) 97.5 F (36.4 C)   Resp 16   Ht 5\' 6"  (1.676 m)   Wt 129 lb 9.6 oz (58.8 kg)   BMI 20.92 kg/m   General Appearance: Well nourished, well groomed and in no apparent distress.  Eyes: PERRLA, EOMs, conjunctiva no swelling or erythema, normal fundi and vessels. Sinuses: No frontal/maxillary tenderness ENT/Mouth: EACs patent / TMs  nl. Nares clear without erythema, swelling, mucoid exudates. Oral hygiene is good. No erythema, swelling, or exudate. Tongue normal, non-obstructing. Tonsils not swollen or erythematous. Hearing normal.  Neck: Supple, thyroid normal. No bruits, nodes or JVD. Respiratory: Respiratory effort normal.  BS equal and clear bilateral without rales, rhonci, wheezing or stridor. Cardio: Heart sounds are normal with regular rate and rhythm and no murmurs, rubs or gallops. Peripheral pulses are normal and equal bilaterally without edema. No aortic or femoral bruits. Chest: symmetric with  normal excursions and percussion. Breasts: per GYN. Abdomen: Flat, soft with bowel sounds active. Nontender, no guarding, rebound, hernias, masses, or organomegaly.  Lymphatics: Non tender without lymphadenopathy.  Genitourinary: per GYN. Musculoskeletal: Full ROM all peripheral extremities, joint stability, 5/5 strength, and normal gait. Skin: Warm and dry without rashes, lesions, cyanosis, clubbing or  ecchymosis.  Neuro: Cranial nerves intact, reflexes equal bilaterally. Normal muscle tone, no cerebellar symptoms. Sensation intact.  Pysch: Alert and oriented X 3, normal affect, Insight and Judgment appropriate.   Assessment and Plan  1. Annual Preventative Screening Examination  2. Elevated BP without diagnosis of hypertension  - EKG 12-Lead - [place],  RETROPERITNL ABD,  LTD - Urinalysis, Routine w reflex microscopic - Microalbumin / creatinine urine ratio - CBC with Differential/Platelet - BASIC METABOLIC PANEL WITH GFR - Magnesium - TSH  3. Elevated cholesterol  - EKG 12-Lead - [place], RETROPERITNL ABD,  LTD - Hepatic function panel - Lipid panel - TSH  4. Other abnormal glucose  - Hemoglobin A1c - Insulin, random  5. Vitamin D deficiency  - VITAMIN D 25 Hydroxy   6. Encounter for colorectal cancer screening  - POC Hemoccult Bld/Stl   7. Screening for ischemic heart disease  - EKG 12-Lead  8. Screening for AAA (aortic abdominal aneurysm)  - [place], RETROPERITNL ABD,  LTD  9. Fatigue, unspecified type  - Iron,Total/Total Iron Binding Cap - Vitamin B12 - CBC with Differential/Platelet  10. Medication management  - Urinalysis, Routine w reflex microscopic - Microalbumin / creatinine urine ratio - CBC with Differential/Platelet  11. Need for immunization against influenza  - FLU VACCINE MDCK QUAD W/Preservative  12. Screening examination for pulmonary tuberculosis  - PPD      Patient was counseled in prudent diet to achieve/maintain BMI less than 25 for weight control, BP monitoring, regular exercise and medications. Discussed med's effects and SE's. Screening labs and tests as requested with regular follow-up as recommended. Over 40 minutes of exam, counseling, chart review and high complex critical decision making was performed.

## 2017-08-14 NOTE — Patient Instructions (Signed)

## 2017-08-15 ENCOUNTER — Other Ambulatory Visit: Payer: Self-pay | Admitting: Internal Medicine

## 2017-08-15 LAB — LIPID PANEL
CHOLESTEROL: 169 mg/dL (ref <200)
HDL: 116 mg/dL (ref >50)
LDL CHOLESTEROL (CALC): 36 mg/dL
Non-HDL Cholesterol (Calc): 53 mg/dL (calc) (ref <130)
TRIGLYCERIDES: 87 mg/dL (ref <150)
Total CHOL/HDL Ratio: 1.5 (calc) (ref <5.0)

## 2017-08-15 LAB — BASIC METABOLIC PANEL WITH GFR
BUN: 15 mg/dL (ref 7–25)
CALCIUM: 9.5 mg/dL (ref 8.6–10.4)
CHLORIDE: 101 mmol/L (ref 98–110)
CO2: 28 mmol/L (ref 20–32)
CREATININE: 0.67 mg/dL (ref 0.50–0.99)
GFR, Est African American: 108 mL/min/{1.73_m2} (ref >=60)
GFR, Est Non African American: 93 mL/min/{1.73_m2} (ref >=60)
GLUCOSE: 88 mg/dL (ref 65–99)
Potassium: 4.5 mmol/L (ref 3.5–5.3)
Sodium: 138 mmol/L (ref 135–146)

## 2017-08-15 LAB — CBC WITH DIFFERENTIAL/PLATELET
BASOS PCT: 0.7 %
Basophils Absolute: 42 cells/uL (ref 0–200)
Eosinophils Absolute: 180 cells/uL (ref 15–500)
Eosinophils Relative: 3 %
HCT: 35.6 % (ref 35.0–45.0)
HEMOGLOBIN: 12.1 g/dL (ref 11.7–15.5)
Lymphs Abs: 1536 cells/uL (ref 850–3900)
MCH: 29.8 pg (ref 27.0–33.0)
MCHC: 34 g/dL (ref 32.0–36.0)
MCV: 87.7 fL (ref 80.0–100.0)
MONOS PCT: 8.2 %
MPV: 9.4 fL (ref 7.5–12.5)
NEUTROS ABS: 3750 {cells}/uL (ref 1500–7800)
Neutrophils Relative %: 62.5 %
Platelets: 275 10*3/uL (ref 140–400)
RBC: 4.06 10*6/uL (ref 3.80–5.10)
RDW: 12.7 % (ref 11.0–15.0)
TOTAL LYMPHOCYTE: 25.6 %
WBC: 6 10*3/uL (ref 3.8–10.8)
WBCMIX: 492 {cells}/uL (ref 200–950)

## 2017-08-15 LAB — HEPATIC FUNCTION PANEL
AG Ratio: 1.9 (calc) (ref 1.0–2.5)
ALBUMIN MSPROF: 4.4 g/dL (ref 3.6–5.1)
ALT: 14 U/L (ref 6–29)
AST: 18 U/L (ref 10–35)
Alkaline phosphatase (APISO): 46 U/L (ref 33–130)
BILIRUBIN INDIRECT: 0.5 mg/dL (ref 0.2–1.2)
Bilirubin, Direct: 0.1 mg/dL (ref 0.0–0.2)
GLOBULIN: 2.3 g/dL (ref 1.9–3.7)
TOTAL PROTEIN: 6.7 g/dL (ref 6.1–8.1)
Total Bilirubin: 0.6 mg/dL (ref 0.2–1.2)

## 2017-08-15 LAB — URINALYSIS, ROUTINE W REFLEX MICROSCOPIC
BILIRUBIN URINE: NEGATIVE
GLUCOSE, UA: NEGATIVE
HGB URINE DIPSTICK: NEGATIVE
Ketones, ur: NEGATIVE
Leukocytes, UA: NEGATIVE
Nitrite: NEGATIVE
PH: 7.5 (ref 5.0–8.0)
Protein, ur: NEGATIVE
Specific Gravity, Urine: 1.006 (ref 1.001–1.03)

## 2017-08-15 LAB — MICROALBUMIN / CREATININE URINE RATIO
Creatinine, Urine: 15 mg/dL — ABNORMAL LOW (ref 20–275)
MICROALB/CREAT RATIO: 13 ug/mg{creat} (ref <30)
Microalb, Ur: 0.2 mg/dL

## 2017-08-15 LAB — HEMOGLOBIN A1C
EAG (MMOL/L): 5.4 (calc)
Hgb A1c MFr Bld: 5 % of total Hgb (ref <5.7)
MEAN PLASMA GLUCOSE: 97 (calc)

## 2017-08-15 LAB — VITAMIN B12: VITAMIN B 12: 641 pg/mL (ref 200–1100)

## 2017-08-15 LAB — IRON, TOTAL/TOTAL IRON BINDING CAP
%SAT: 28 % (calc) (ref 11–50)
IRON: 103 ug/dL (ref 45–160)
TIBC: 367 mcg/dL (calc) (ref 250–450)

## 2017-08-15 LAB — MAGNESIUM: MAGNESIUM: 2.2 mg/dL (ref 1.5–2.5)

## 2017-08-15 LAB — INSULIN, RANDOM: INSULIN: 1.4 u[IU]/mL — AB (ref 2.0–19.6)

## 2017-08-15 LAB — TSH: TSH: 3.31 mIU/L (ref 0.40–4.50)

## 2017-08-15 LAB — VITAMIN D 25 HYDROXY (VIT D DEFICIENCY, FRACTURES): Vit D, 25-Hydroxy: 49 ng/mL (ref 30–100)

## 2017-08-16 ENCOUNTER — Encounter: Payer: Self-pay | Admitting: Internal Medicine

## 2017-08-16 ENCOUNTER — Other Ambulatory Visit: Payer: Self-pay | Admitting: Internal Medicine

## 2017-08-18 LAB — TB SKIN TEST
INDURATION: 0 mm
TB SKIN TEST: NEGATIVE

## 2017-08-20 ENCOUNTER — Other Ambulatory Visit: Payer: Self-pay | Admitting: *Deleted

## 2017-08-20 ENCOUNTER — Other Ambulatory Visit: Payer: Self-pay | Admitting: Internal Medicine

## 2017-08-20 MED ORDER — ACYCLOVIR 5 % EX CREA: 1.0000 "application " | TOPICAL_CREAM | CUTANEOUS | 1 refills | Status: DC

## 2017-08-25 ENCOUNTER — Other Ambulatory Visit: Payer: Self-pay | Admitting: *Deleted

## 2017-08-25 MED ORDER — ACYCLOVIR 5 % EX OINT: 1.0000 "application " | TOPICAL_OINTMENT | CUTANEOUS | 3 refills | Status: DC

## 2017-08-28 ENCOUNTER — Other Ambulatory Visit: Payer: Self-pay | Admitting: Obstetrics and Gynecology

## 2017-08-28 DIAGNOSIS — Z1231 Encounter for screening mammogram for malignant neoplasm of breast: Secondary | ICD-10-CM

## 2017-09-16 ENCOUNTER — Ambulatory Visit: Payer: BLUE CROSS/BLUE SHIELD | Admitting: Physician Assistant

## 2017-09-16 ENCOUNTER — Encounter: Payer: Self-pay | Admitting: Physician Assistant

## 2017-09-16 VITALS — BP 120/80 | HR 89 | Temp 97.9°F | Resp 14 | Ht 66.0 in | Wt 130.0 lb

## 2017-09-16 DIAGNOSIS — A09 Infectious gastroenteritis and colitis, unspecified: Secondary | ICD-10-CM

## 2017-09-16 LAB — CBC WITH DIFFERENTIAL/PLATELET
BASOS ABS: 43 {cells}/uL (ref 0–200)
Basophils Relative: 0.7 %
EOS PCT: 1.3 %
Eosinophils Absolute: 79 cells/uL (ref 15–500)
HCT: 36.9 % (ref 35.0–45.0)
HEMOGLOBIN: 12.5 g/dL (ref 11.7–15.5)
Lymphs Abs: 1458 cells/uL (ref 850–3900)
MCH: 29.8 pg (ref 27.0–33.0)
MCHC: 33.9 g/dL (ref 32.0–36.0)
MCV: 88.1 fL (ref 80.0–100.0)
MONOS PCT: 8.6 %
MPV: 9.2 fL (ref 7.5–12.5)
NEUTROS ABS: 3996 {cells}/uL (ref 1500–7800)
Neutrophils Relative %: 65.5 %
Platelets: 303 10*3/uL (ref 140–400)
RBC: 4.19 10*6/uL (ref 3.80–5.10)
RDW: 13 % (ref 11.0–15.0)
Total Lymphocyte: 23.9 %
WBC mixed population: 525 cells/uL (ref 200–950)
WBC: 6.1 10*3/uL (ref 3.8–10.8)

## 2017-09-16 LAB — BASIC METABOLIC PANEL WITH GFR
BUN: 15 mg/dL (ref 7–25)
CO2: 27 mmol/L (ref 20–32)
CREATININE: 0.66 mg/dL (ref 0.50–0.99)
Calcium: 9.5 mg/dL (ref 8.6–10.4)
Chloride: 101 mmol/L (ref 98–110)
GFR, Est African American: 108 mL/min/{1.73_m2} (ref >=60)
GFR, Est Non African American: 93 mL/min/{1.73_m2} (ref >=60)
GLUCOSE: 92 mg/dL (ref 65–99)
POTASSIUM: 3.9 mmol/L (ref 3.5–5.3)
SODIUM: 136 mmol/L (ref 135–146)

## 2017-09-16 MED ORDER — HYOSCYAMINE SULFATE 0.125 MG PO TABS: 0.1250 mg | ORAL_TABLET | ORAL | 1 refills | Status: DC | PRN

## 2017-09-16 NOTE — Progress Notes (Signed)
Subjective:    Patient ID: Kimberly Murphy, female    DOB: 03/22/1951, 66 y.o.   MRN: [id number]  HPI 66 y.o. WF with history of diverticulitis presents with diarrhea intermittent x 2 weeks. She recalls eating at [facility] 2 weeks ago, had stomach pain/cramps, since then has had loose stools 2-3 x a day with some cramping, urgency. Improving slightly with imodium/levsin but has been traveling both weekends with issues. No weight loss, no fever, chills, some dizziness. Some nausea, no vomiting.  Last colonoscopy 2009 Dr. Henrene Pastor due next year.   BMI is Body mass index is 20.98 kg/m., she is working on diet and exercise. Wt Readings from Last 3 Encounters:  09/16/17 130 lb (59 kg)  08/14/17 129 lb 9.6 oz (58.8 kg)  09/27/16 131 lb 6.4 oz (59.6 kg)   Blood pressure 120/80, pulse 89, temperature 97.9 F (36.6 C), resp. rate 14, height 5\' 6"  (1.676 m), weight 130 lb (59 kg), SpO2 98 %.  Medications Current Outpatient Medications on File Prior to Visit  Medication Sig  . acyclovir ointment (ZOVIRAX) 5 % Apply 1 application topically every 3 (three) hours.  Marland Kitchen aspirin 81 MG tablet Take 81 mg by mouth daily.  Marland Kitchen CALCIUM CITRATE PO Take 1 tablet by mouth daily.   . Cholecalciferol (VITAMIN D-3) 5000 UNITS TABS Take 2,500 Units by mouth daily.  . Cyanocobalamin (B-12) 1000 MCG SUBL Place 1,000 mcg under the tongue daily.   Marland Kitchen estradiol (VIVELLE-DOT) 0.0375 MG/24HR Place 1 patch onto the skin 2 (two) times a week.  . loratadine (CLARITIN) 10 MG tablet Take 1 tablet (10 mg total) by mouth daily as needed for allergies.  . Magnesium 250 MG TABS Take 250 mg by mouth daily.   . Melatonin 5 MG TABS Take 1 tablet by mouth at bedtime.  . progesterone (PROMETRIUM) 200 MG capsule Take 200 mg by mouth daily. Takes for 12 days at the beginning of Every Other Month  . Sulfacetamide Sodium 10 % SUSP Apply 1 time daily as directed only.   No current facility-administered medications on file prior to visit.      Problem list She has IBS (irritable bowel syndrome); Vitamin D deficiency; B12 deficiency; Elevated BP, hx; and Body mass index (BMI) of 20.0-20.9 in adult on their problem list.   Review of Systems  Constitutional: Positive for fatigue. Negative for chills, diaphoresis and fever.  HENT: Negative.   Respiratory: Negative.  Negative for cough.   Cardiovascular: Negative.   Gastrointestinal: Positive for abdominal pain, diarrhea and nausea. Negative for abdominal distention, anal bleeding, blood in stool, constipation, rectal pain and vomiting.  Genitourinary: Negative.   Musculoskeletal: Positive for myalgias. Negative for arthralgias, back pain, gait problem, joint swelling, neck pain and neck stiffness.  Skin: Negative.   Neurological: Positive for dizziness. Negative for headaches.       Objective:   Physical Exam  Constitutional: She is oriented to person, place, and time. She appears well-developed and well-nourished.  HENT:  Head: Normocephalic and atraumatic.  Right Ear: External ear normal.  Left Ear: External ear normal.  Mouth/Throat: Oropharynx is clear and moist.  Eyes: Conjunctivae and EOM are normal. Pupils are equal, round, and reactive to light.  Neck: Normal range of motion. Neck supple. No thyromegaly present.  Cardiovascular: Normal rate, regular rhythm and normal heart sounds. Exam reveals no gallop and no friction rub.  No murmur heard. Pulmonary/Chest: Effort normal and breath sounds normal. No respiratory distress. She has no wheezes.  Abdominal: Soft. Bowel sounds are normal. She exhibits no distension and no mass. There is no tenderness. There is no rebound and no guarding.  Musculoskeletal: Normal range of motion.  Lymphadenopathy:    She has no cervical adenopathy.  Neurological: She is alert and oriented to person, place, and time. She displays normal reflexes. No cranial nerve deficit. Coordination normal.  Skin: Skin is warm and dry.   Psychiatric: She has a normal mood and affect.       Assessment & Plan:   Infectious diarrhea, with benign abdomen exam -     CBC with Differential/Platelet -     BASIC METABOLIC PANEL WITH GFR -     Gastrointestinal Pathogen Panel PCR; Future  Infectious diarrhea, mild in severity:  Appropriate educational material discussed and distributed. Lab studies per orders. OTC antidiarrheals may be used judiciously. Stool studies per orders. Check labs. The 'BRAT' diet is suggested, then progress to diet as tolerated. Call if bloody stools, persistent diarrhea, not voiding regularly, unable to take oral fluids, vomiting, high fever, severe weakness, abdominal pain or failure to improve in 2-3 days.

## 2017-09-16 NOTE — Patient Instructions (Signed)
Food Choices to Help Relieve Diarrhea, Adult When you have diarrhea, the foods you eat and your eating habits are very important. Choosing the right foods and drinks can help:  Relieve diarrhea.  Replace lost fluids and nutrients.  Prevent dehydration.  What general guidelines should I follow? Relieving diarrhea  Choose foods with less than 2 g or .07 oz. of fiber per serving.  Limit fats to less than 8 tsp (38 g or 1.34 oz.) a day.  Avoid the following: ? Foods and beverages sweetened with high-fructose corn syrup, honey, or sugar alcohols such as xylitol, sorbitol, and mannitol. ? Foods that contain a lot of fat or sugar. ? Fried, greasy, or spicy foods. ? High-fiber grains, breads, and cereals. ? Raw fruits and vegetables.  Eat foods that are rich in probiotics. These foods include dairy products such as yogurt and fermented milk products. They help increase healthy bacteria in the stomach and intestines (gastrointestinal tract, or GI tract).  If you have lactose intolerance, avoid dairy products. These may make your diarrhea worse.  Take medicine to help stop diarrhea (antidiarrheal medicine) only as told by your health care provider. Replacing nutrients  Eat small meals or snacks every 3-4 hours.  Eat bland foods, such as white rice, toast, or baked potato, until your diarrhea starts to get better. Gradually reintroduce nutrient-rich foods as tolerated or as told by your health care provider. This includes: ? Well-cooked protein foods. ? Peeled, seeded, and soft-cooked fruits and vegetables. ? Low-fat dairy products.  Take vitamin and mineral supplements as told by your health care provider. Preventing dehydration   Start by sipping water or a special solution to prevent dehydration (oral rehydration solution, ORS). Urine that is clear or pale yellow means that you are getting enough fluid.  Try to drink at least 8-10 cups of fluid each day to help replace lost  fluids.  You may add other liquids in addition to water, such as clear juice or decaffeinated sports drinks, as tolerated or as told by your health care provider.  Avoid drinks with caffeine, such as coffee, tea, or soft drinks.  Avoid alcohol. What foods are recommended? The items listed may not be a complete list. Talk with your health care provider about what dietary choices are best for you. Grains White rice. White, French, or pita breads (fresh or toasted), including plain rolls, buns, or bagels. White pasta. Saltine, soda, or graham crackers. Pretzels. Low-fiber cereal. Cooked cereals made with water (such as cornmeal, farina, or cream cereals). Plain muffins. Matzo. Melba toast. Zwieback. Vegetables Potatoes (without the skin). Most well-cooked and canned vegetables without skins or seeds. Tender lettuce. Fruits Apple sauce. Fruits canned in juice. Cooked apricots, cherries, grapefruit, peaches, pears, or plums. Fresh bananas and cantaloupe. Meats and other protein foods Baked or boiled chicken. Eggs. Tofu. Fish. Seafood. Smooth nut butters. Ground or well-cooked tender beef, ham, veal, lamb, pork, or poultry. Dairy Plain yogurt, kefir, and unsweetened liquid yogurt. Lactose-free milk, buttermilk, skim milk, or soy milk. Low-fat or nonfat hard cheese. Beverages Water. Low-calorie sports drinks. Fruit juices without pulp. Strained tomato and vegetable juices. Decaffeinated teas. Sugar-free beverages not sweetened with sugar alcohols. Oral rehydration solutions, if approved by your health care provider. Seasoning and other foods Bouillon, broth, or soups made from recommended foods. What foods are not recommended? The items listed may not be a complete list. Talk with your health care provider about what dietary choices are best for you. Grains Whole grain, whole wheat,   bran, or rye breads, rolls, pastas, and crackers. Wild or brown rice. Whole grain or bran cereals. Barley. Oats and  oatmeal. Corn tortillas or taco shells. Granola. Popcorn. Vegetables Raw vegetables. Fried vegetables. Cabbage, broccoli, Brussels sprouts, artichokes, baked beans, beet greens, corn, kale, legumes, peas, sweet potatoes, and yams. Potato skins. Cooked spinach and cabbage. Fruits Dried fruit, including raisins and dates. Raw fruits. Stewed or dried prunes. Canned fruits with syrup. Meat and other protein foods Fried or fatty meats. Deli meats. Chunky nut butters. Nuts and seeds. Beans and lentils. Bacon. Hot dogs. Sausage. Dairy High-fat cheeses. Whole milk, chocolate milk, and beverages made with milk, such as milk shakes. Half-and-half. Cream. sour cream. Ice cream. Beverages Caffeinated beverages (such as coffee, tea, soda, or energy drinks). Alcoholic beverages. Fruit juices with pulp. Prune juice. Soft drinks sweetened with high-fructose corn syrup or sugar alcohols. High-calorie sports drinks. Fats and oils Butter. Cream sauces. Margarine. Salad oils. Plain salad dressings. Olives. Avocados. Mayonnaise. Sweets and desserts Sweet rolls, doughnuts, and sweet breads. Sugar-free desserts sweetened with sugar alcohols such as xylitol and sorbitol. Seasoning and other foods Honey. Hot sauce. Chili powder. Gravy. Cream-based or milk-based soups. Pancakes and waffles. Summary  When you have diarrhea, the foods you eat and your eating habits are very important.  Make sure you get at least 8-10 cups of fluid each day, or enough to keep your urine clear or pale yellow.  Eat bland foods and gradually reintroduce healthy, nutrient-rich foods as tolerated, or as told by your health care provider.  Avoid high-fiber, fried, greasy, or spicy foods. This information is not intended to replace advice given to you by your health care provider. Make sure you discuss any questions you have with your health care provider. Document Released: 01/18/2004 Document Revised: 10/25/2016 Document Reviewed:  10/25/2016 Elsevier Interactive Patient Education  2017 [employer]  

## 2017-09-17 ENCOUNTER — Other Ambulatory Visit: Payer: Self-pay | Admitting: Physician Assistant

## 2017-09-17 MED ORDER — DICYCLOMINE HCL 10 MG PO CAPS: 10.0000 mg | ORAL_CAPSULE | Freq: Three times a day (TID) | ORAL | 0 refills | Status: DC | PRN

## 2017-09-17 NOTE — Addendum Note (Signed)
Addended by: Eulis Canner on: 09/17/2017 04:27 PM   Modules accepted: Orders

## 2017-09-18 LAB — GASTROINTESTINAL PATHOGEN PANEL PCR
C. DIFFICILE TOX A/B, PCR: NOT DETECTED
CAMPYLOBACTER, PCR: NOT DETECTED
Cryptosporidium, PCR: NOT DETECTED
E COLI (ETEC) LT/ST, PCR: NOT DETECTED
E COLI 0157, PCR: NOT DETECTED
E coli (STEC) stx1/stx2, PCR: NOT DETECTED
GIARDIA LAMBLIA, PCR: NOT DETECTED
Norovirus, PCR: NOT DETECTED
Rotavirus A, PCR: NOT DETECTED
Salmonella, PCR: NOT DETECTED
Shigella, PCR: NOT DETECTED

## 2017-09-22 ENCOUNTER — Ambulatory Visit
Admission: RE | Admit: 2017-09-22 | Discharge: 2017-09-22 | Disposition: A | Discharge status: Home / Self Care | Payer: BLUE CROSS/BLUE SHIELD | Source: Ambulatory Visit | Attending: Obstetrics and Gynecology | Admitting: Obstetrics and Gynecology

## 2017-09-22 ENCOUNTER — Ambulatory Visit: Payer: BLUE CROSS/BLUE SHIELD

## 2017-09-22 DIAGNOSIS — Z1231 Encounter for screening mammogram for malignant neoplasm of breast: Secondary | ICD-10-CM

## 2017-09-23 ENCOUNTER — Encounter (INDEPENDENT_AMBULATORY_CARE_PROVIDER_SITE_OTHER): Payer: Self-pay

## 2017-09-29 ENCOUNTER — Encounter: Payer: Self-pay | Admitting: Internal Medicine

## 2018-02-17 ENCOUNTER — Other Ambulatory Visit: Payer: Self-pay | Admitting: Obstetrics and Gynecology

## 2018-02-17 ENCOUNTER — Other Ambulatory Visit (HOSPITAL_COMMUNITY)
Admission: RE | Admit: 2018-02-17 | Discharge: 2018-02-17 | Disposition: A | Discharge status: Home / Self Care | Payer: PPO | Source: Ambulatory Visit | Attending: Obstetrics and Gynecology | Admitting: Obstetrics and Gynecology

## 2018-02-17 DIAGNOSIS — Z01419 Encounter for gynecological examination (general) (routine) without abnormal findings: Secondary | ICD-10-CM | POA: Diagnosis not present

## 2018-02-17 DIAGNOSIS — Z124 Encounter for screening for malignant neoplasm of cervix: Secondary | ICD-10-CM | POA: Insufficient documentation

## 2018-02-20 LAB — CYTOLOGY - PAP
DIAGNOSIS: UNDETERMINED — AB
HPV (WINDOPATH): NOT DETECTED

## 2018-03-17 DIAGNOSIS — L821 Other seborrheic keratosis: Secondary | ICD-10-CM | POA: Diagnosis not present

## 2018-03-17 DIAGNOSIS — D225 Melanocytic nevi of trunk: Secondary | ICD-10-CM | POA: Diagnosis not present

## 2018-03-17 DIAGNOSIS — D2272 Melanocytic nevi of left lower limb, including hip: Secondary | ICD-10-CM | POA: Diagnosis not present

## 2018-03-17 DIAGNOSIS — D2271 Melanocytic nevi of right lower limb, including hip: Secondary | ICD-10-CM | POA: Diagnosis not present

## 2018-03-17 DIAGNOSIS — D2262 Melanocytic nevi of left upper limb, including shoulder: Secondary | ICD-10-CM | POA: Diagnosis not present

## 2018-03-17 DIAGNOSIS — D2261 Melanocytic nevi of right upper limb, including shoulder: Secondary | ICD-10-CM | POA: Diagnosis not present

## 2018-03-17 DIAGNOSIS — D1801 Hemangioma of skin and subcutaneous tissue: Secondary | ICD-10-CM | POA: Diagnosis not present

## 2018-03-17 DIAGNOSIS — L814 Other melanin hyperpigmentation: Secondary | ICD-10-CM | POA: Diagnosis not present
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
## 2018-06-18 ENCOUNTER — Encounter: Payer: Self-pay | Admitting: Internal Medicine

## 2018-07-07 ENCOUNTER — Other Ambulatory Visit: Payer: Self-pay | Admitting: Internal Medicine

## 2018-07-07 DIAGNOSIS — Z1212 Encounter for screening for malignant neoplasm of rectum: Principal | ICD-10-CM

## 2018-07-07 DIAGNOSIS — Z1211 Encounter for screening for malignant neoplasm of colon: Secondary | ICD-10-CM

## 2018-07-07 DIAGNOSIS — E2839 Other primary ovarian failure: Secondary | ICD-10-CM

## 2018-07-09 ENCOUNTER — Other Ambulatory Visit: Payer: Self-pay | Admitting: Internal Medicine

## 2018-07-09 DIAGNOSIS — Z1231 Encounter for screening mammogram for malignant neoplasm of breast: Secondary | ICD-10-CM

## 2018-07-09 DIAGNOSIS — L821 Other seborrheic keratosis: Secondary | ICD-10-CM | POA: Diagnosis not present

## 2018-07-23 ENCOUNTER — Telehealth: Payer: Self-pay | Admitting: Internal Medicine

## 2018-07-23 NOTE — Telephone Encounter (Signed)
patient called and requested Cologurad , per Dr Melford Aase order placed for Cologuard.

## 2018-08-10 DIAGNOSIS — Z1211 Encounter for screening for malignant neoplasm of colon: Secondary | ICD-10-CM | POA: Diagnosis not present

## 2018-08-10 DIAGNOSIS — Z1212 Encounter for screening for malignant neoplasm of rectum: Secondary | ICD-10-CM | POA: Diagnosis not present

## 2018-08-14 LAB — COLOGUARD: Cologuard: NEGATIVE

## 2018-08-17 ENCOUNTER — Encounter: Payer: Self-pay | Admitting: *Deleted

## 2018-08-20 ENCOUNTER — Encounter: Payer: Self-pay | Admitting: Internal Medicine

## 2018-08-20 ENCOUNTER — Ambulatory Visit
Admission: RE | Admit: 2018-08-20 | Discharge: 2018-08-20 | Disposition: A | Discharge status: Home / Self Care | Payer: PRIVATE HEALTH INSURANCE | Source: Ambulatory Visit | Attending: Internal Medicine | Admitting: Internal Medicine

## 2018-08-20 DIAGNOSIS — Z1231 Encounter for screening mammogram for malignant neoplasm of breast: Secondary | ICD-10-CM | POA: Diagnosis not present

## 2018-09-03 ENCOUNTER — Ambulatory Visit
Admission: RE | Admit: 2018-09-03 | Discharge: 2018-09-03 | Disposition: A | Discharge status: Home / Self Care | Payer: PPO | Source: Ambulatory Visit | Attending: Internal Medicine | Admitting: Internal Medicine

## 2018-09-03 ENCOUNTER — Ambulatory Visit (INDEPENDENT_AMBULATORY_CARE_PROVIDER_SITE_OTHER): Payer: PPO | Admitting: Internal Medicine

## 2018-09-03 ENCOUNTER — Encounter: Payer: Self-pay | Admitting: Internal Medicine

## 2018-09-03 VITALS — BP 116/70 | HR 72 | Temp 97.2°F | Resp 16 | Ht 66.0 in | Wt 131.8 lb

## 2018-09-03 DIAGNOSIS — Z1212 Encounter for screening for malignant neoplasm of rectum: Secondary | ICD-10-CM

## 2018-09-03 DIAGNOSIS — Z1211 Encounter for screening for malignant neoplasm of colon: Secondary | ICD-10-CM

## 2018-09-03 DIAGNOSIS — Z136 Encounter for screening for cardiovascular disorders: Secondary | ICD-10-CM | POA: Diagnosis not present

## 2018-09-03 DIAGNOSIS — R7309 Other abnormal glucose: Secondary | ICD-10-CM

## 2018-09-03 DIAGNOSIS — E559 Vitamin D deficiency, unspecified: Secondary | ICD-10-CM

## 2018-09-03 DIAGNOSIS — Z0000 Encounter for general adult medical examination without abnormal findings: Secondary | ICD-10-CM | POA: Diagnosis not present

## 2018-09-03 DIAGNOSIS — R030 Elevated blood-pressure reading, without diagnosis of hypertension: Secondary | ICD-10-CM

## 2018-09-03 DIAGNOSIS — E2839 Other primary ovarian failure: Secondary | ICD-10-CM

## 2018-09-03 DIAGNOSIS — Z79899 Other long term (current) drug therapy: Secondary | ICD-10-CM

## 2018-09-03 DIAGNOSIS — Z0001 Encounter for general adult medical examination with abnormal findings: Secondary | ICD-10-CM

## 2018-09-03 DIAGNOSIS — Z23 Encounter for immunization: Secondary | ICD-10-CM

## 2018-09-03 DIAGNOSIS — M8589 Other specified disorders of bone density and structure, multiple sites: Secondary | ICD-10-CM | POA: Diagnosis not present

## 2018-09-03 DIAGNOSIS — Z8249 Family history of ischemic heart disease and other diseases of the circulatory system: Secondary | ICD-10-CM

## 2018-09-03 DIAGNOSIS — Z780 Asymptomatic menopausal state: Secondary | ICD-10-CM | POA: Diagnosis not present

## 2018-09-03 DIAGNOSIS — E7800 Pure hypercholesterolemia, unspecified: Secondary | ICD-10-CM | POA: Diagnosis not present

## 2018-09-03 DIAGNOSIS — E538 Deficiency of other specified B group vitamins: Secondary | ICD-10-CM

## 2018-09-03 NOTE — Progress Notes (Addendum)
[facility] Unk Pinto, M.D.     Uvaldo Bristle. Silverio Lay, P.A.-C Liane Comber, [place] [address] [place] [address] Telephone [phone] Telefax [phone] Annual Screening/Preventative Visit & Comprehensive Evaluation &  Examination     This very nice 67 y.o. MWF  presents for a Screening /Preventative Visit & comprehensive evaluation and management of multiple medical co-morbidities.  Patient has been followed for HTN, HLD, Prediabetes  and Vitamin D Deficiency.  Patient has a very remote hx/o Galactorrhea and elevated Prolactin levels and a ? of a Pituitary Microadenoma and in 2010 Dr Ellene Route felt that her previous MRI's had been over interpreted and that she did not have a Pituitary Adenoma. She has had annual Visual Field screening by Dr Claudean Kinds for years      Patient has remote hx/o an elevated BP (130/86 in 2016) and is followed expectantly. Patient's BP has been controlled at home and patient denies any cardiac symptoms as chest pain, palpitations, shortness of breath, dizziness or ankle swelling. Today's BP is at goal - 116/70.      Patient's lipids are  controlled with diet. Last lipids were at goal: Lab Results  Component Value Date   CHOL 177 09/03/2018   HDL 85 09/03/2018   LDLCALC 73 09/03/2018   TRIG 109 09/03/2018   CHOLHDL 2.1 09/03/2018      Patient has is monitored expectantly for prediabetes and patient denies reactive hypoglycemic symptoms, visual blurring, diabetic polys or paresthesias. Last A1c was Normal & at goal: Lab Results  Component Value Date   HGBA1C 5.1 09/03/2018      Finally, patient has history of Vitamin D Deficiency and last Vitamin D was low (goal 70-100): Lab Results  Component Value Date   VD25OH 44 09/03/2018   Current Outpatient Medications on File Prior to Visit  Medication Sig  . acyclovir ointment (ZOVIRAX) 5 % Apply 1 application topically  every 3 (three) hours.  Marland Kitchen aspirin 81 MG tablet Take 81 mg by mouth daily.  Marland Kitchen BIOTIN PO Take 1 capsule by mouth daily.  Marland Kitchen CALCIUM CITRATE PO Take 1 tablet by mouth daily.   . Cholecalciferol (VITAMIN D-3) 5000 UNITS TABS Take 2,500 Units by mouth daily.  . Cyanocobalamin (B-12) 1000 MCG SUBL Place 1,000 mcg under the tongue daily.   Marland Kitchen estradiol (VIVELLE-DOT) 0.0375 MG/24HR Place 1 patch onto the skin 2 (two) times a week.  . loratadine (CLARITIN) 10 MG tablet Take 1 tablet (10 mg total) by mouth daily as needed for allergies.  . Magnesium 250 MG TABS Take 250 mg by mouth daily.   . Melatonin 5 MG TABS Take 1 tablet by mouth at bedtime.  . progesterone (PROMETRIUM) 200 MG capsule Take 200 mg by mouth daily. Takes for 12 days at the beginning of Every Other Month  . Sulfacetamide Sodium 10 % SUSP Apply 1 time daily as directed only.  . dicyclomine (BENTYL) 10 MG capsule Take 1 capsule (10 mg total) 3 (three) times daily as needed for up to 14 days by mouth for spasms.   No current facility-administered medications on file prior to visit.    Allergies  Allergen Reactions  . Pseudoephedrine-Dm-Gg     Increased Heart rate  . Seldane [Terfenadine]     Rapid palpitations  . Shellfish Allergy     N/V, diarrhea   Past Medical History:  Diagnosis Date  . Allergy   . Aphthous stomatitis   . B12  deficiency   . Galactorrhea    with elevated Prolactin levels  . Hemorrhoid   . HSV-1 (herpes simplex virus 1) infection   . IBS (irritable bowel syndrome)   . Pituitary microadenoma ([place])   . Synovitis of finger 2003   Left ring finger  . Tennis elbow 2007   Right elbow  . Vitamin D deficiency    Health Maintenance  Topic Date Due  . MAMMOGRAM  08/20/2020  . PAP SMEAR  02/17/2021  . Fecal DNA (Cologuard)  08/14/2021  . TETANUS/TDAP  09/28/2024  . INFLUENZA VACCINE  Completed  . DEXA SCAN  Completed  . Hepatitis C Screening  Completed  . HIV Screening  Completed  . PNA vac Low Risk  Adult  Completed   Immunization History  Administered Date(s) Administered  . DT 09/28/2014  . Influenza Inj Mdck Quad With Preservative 08/14/2017  . Influenza Split 09/28/2013, 09/28/2014  . Influenza, High Dose Seasonal PF 09/03/2018  . Influenza, Seasonal, Injecte, Preservative Fre 09/22/2015  . Influenza,inj,quad, With Preservative 09/27/2016  . PPD Test 09/28/2013, 08/14/2017  . Pneumococcal Conjugate-13 09/28/2014  . Pneumococcal Polysaccharide-23 09/03/2018  . Pneumococcal-Unspecified 11/11/1998  . Td 11/12/2003  . Zoster 11/11/2005   Cologard - 08/24/2918 - was Negative - due f/u 3 yrs in Oct 2022 Last MGM - 08/20/2018 Dexa BMD - today   Past Surgical History:  Procedure Laterality Date  . DILATION AND CURETTAGE OF UTERUS  1994 & 1996   Family History  Problem Relation Age of Onset  . Cancer Mother        Breast  . Ulcerative colitis Mother   . Arthritis Father   . Hypertension Father   . Macular degeneration Father    Social History   Tobacco Use  . Smoking status: Never Smoker  . Smokeless tobacco: Never Used  Substance Use Topics  . Alcohol use: Yes    Alcohol/week: 14.0 standard drinks    Types: 14 Standard drinks or equivalent per week  . Drug use: Not on file    ROS Constitutional: Denies fever, chills, weight loss/gain, headaches, insomnia,  night sweats, and change in appetite. Does c/o fatigue. Eyes: Denies redness, blurred vision, diplopia, discharge, itchy, watery eyes.  ENT: Denies discharge, congestion, post nasal drip, epistaxis, sore throat, earache, hearing loss, dental pain, Tinnitus, Vertigo, Sinus pain, snoring.  Cardio: Denies chest pain, palpitations, irregular heartbeat, syncope, dyspnea, diaphoresis, orthopnea, PND, claudication, edema Respiratory: denies cough, dyspnea, DOE, pleurisy, hoarseness, laryngitis, wheezing.  Gastrointestinal: Denies dysphagia, heartburn, reflux, water brash, pain, cramps, nausea, vomiting, bloating,  diarrhea, constipation, hematemesis, melena, hematochezia, jaundice, hemorrhoids Genitourinary: Denies dysuria, frequency, urgency, nocturia, hesitancy, discharge, hematuria, flank pain Breast: Breast lumps, nipple discharge, bleeding.  Musculoskeletal: Denies arthralgia, myalgia, stiffness, Jt. Swelling, pain, limp, and strain/sprain. Denies falls. Skin: Denies puritis, rash, hives, warts, acne, eczema, changing in skin lesion Neuro: No weakness, tremor, incoordination, spasms, paresthesia, pain Psychiatric: Denies confusion, memory loss, sensory loss. Denies Depression. Endocrine: Denies change in weight, skin, hair change, nocturia, and paresthesia, diabetic polys, visual blurring, hyper / hypo glycemic episodes.  Heme/Lymph: No excessive bleeding, bruising, enlarged lymph nodes.  Physical Exam  BP 116/70   Pulse 72   Temp (!) 97.2 F (36.2 C)   Resp 16   Ht 5\' 6"  (1.676 m)   Wt 131 lb 12.8 oz (59.8 kg)   BMI 21.27 kg/m   General Appearance: Well nourished, well groomed and in no apparent distress.  Eyes: PERRLA, EOMs, conjunctiva no swelling or  erythema, normal fundi and vessels. Sinuses: No frontal/maxillary tenderness ENT/Mouth: EACs patent / TMs  nl. Nares clear without erythema, swelling, mucoid exudates. Oral hygiene is good. No erythema, swelling, or exudate. Tongue normal, non-obstructing. Tonsils not swollen or erythematous. Hearing normal.  Neck: Supple, thyroid not palpable. No bruits, nodes or JVD. Respiratory: Respiratory effort normal.  BS equal and clear bilateral without rales, rhonci, wheezing or stridor. Cardio: Heart sounds are normal with regular rate and rhythm and no murmurs, rubs or gallops. Peripheral pulses are normal and equal bilaterally without edema. No aortic or femoral bruits. Chest: symmetric with normal excursions and percussion. Breasts: Symmetric, without lumps, nipple discharge, retractions, or fibrocystic changes.  Abdomen: Flat, soft with bowel  sounds active. Nontender, no guarding, rebound, hernias, masses, or organomegaly.  Lymphatics: Non tender without lymphadenopathy.  Genitourinary:  Musculoskeletal: Full ROM all peripheral extremities, joint stability, 5/5 strength, and normal gait. Skin: Warm and dry without rashes, lesions, cyanosis, clubbing or  ecchymosis.  Neuro: Cranial nerves intact, reflexes equal bilaterally. Normal muscle tone, no cerebellar symptoms. Sensation intact.  Pysch: Alert and oriented X 3, normal affect, Insight and Judgment appropriate.   Assessment and Plan  1. Annual Preventative Screening Examination  2. Elevated BP without diagnosis of hypertension  - EKG 12-Lead - Urinalysis, Routine w reflex microscopic - Microalbumin / creatinine urine ratio - CBC with Differential/Platelet - COMPLETE METABOLIC PANEL WITH GFR - Magnesium - TSH  3. Elevated cholesterol  - EKG 12-Lead - Lipid panel - TSH  4. Other abnormal glucose  - EKG 12-Lead - Hemoglobin A1c - Insulin, random  5. Vitamin D deficiency  - VITAMIN D 25 Hydroxyl  6. B12 deficiency  - Vitamin B12 - CBC with Differential/Platelet  7. Encounter for colorectal cancer screening  - POC Hemoccult Bld/Stl  8. Screening for ischemic heart disease  - EKG 12-Lead  9. FH: hypertension  - EKG 12-Lead  10. Need for prophylactic vaccination and inoculation against influenza  - Flu vaccine HIGH DOSE PF (Fluzone High dose)  11. Need for prophylactic vaccination against Streptococcus pneumoniae (pneumococcus)  - Pneumococcal polysaccharide vaccine 23-valent greater than or equal to 2yo subcutaneous/IM  12. Need for shingles vaccine  - Varicella zoster antibody, IgG  13. Medication management  - Urinalysis, Routine w reflex microscopic - Microalbumin / creatinine urine ratio - CBC with Differential/Platelet - COMPLETE METABOLIC PANEL WITH GFR - Magnesium - Lipid panel - TSH - Hemoglobin A1c - Insulin, random -  VITAMIN D 25 Hydroxyl        Patient was counseled in prudent diet to achieve/maintain BMI less than 25 for weight control, BP monitoring, regular exercise and medications. Discussed med's effects and SE's. Screening labs and tests as requested with regular follow-up as recommended. Over 40 minutes of exam, counseling, chart review and high complex critical decision making was performed.

## 2018-09-03 NOTE — Patient Instructions (Signed)

## 2018-09-04 LAB — COMPLETE METABOLIC PANEL WITH GFR
AG Ratio: 2.1 (calc) (ref 1.0–2.5)
ALKALINE PHOSPHATASE (APISO): 50 U/L (ref 33–130)
ALT: 12 U/L (ref 6–29)
AST: 15 U/L (ref 10–35)
Albumin: 4.6 g/dL (ref 3.6–5.1)
BILIRUBIN TOTAL: 0.6 mg/dL (ref 0.2–1.2)
BUN: 15 mg/dL (ref 7–25)
CHLORIDE: 102 mmol/L (ref 98–110)
CO2: 28 mmol/L (ref 20–32)
CREATININE: 0.63 mg/dL (ref 0.50–0.99)
Calcium: 9.6 mg/dL (ref 8.6–10.4)
GFR, Est African American: 109 mL/min/{1.73_m2} (ref >=60)
GFR, Est Non African American: 94 mL/min/{1.73_m2} (ref >=60)
GLUCOSE: 85 mg/dL (ref 65–99)
Globulin: 2.2 g/dL (calc) (ref 1.9–3.7)
Potassium: 4 mmol/L (ref 3.5–5.3)
Sodium: 139 mmol/L (ref 135–146)
Total Protein: 6.8 g/dL (ref 6.1–8.1)

## 2018-09-04 LAB — CBC WITH DIFFERENTIAL/PLATELET
BASOS PCT: 0.8 %
Basophils Absolute: 61 cells/uL (ref 0–200)
EOS ABS: 129 {cells}/uL (ref 15–500)
Eosinophils Relative: 1.7 %
HCT: 36.2 % (ref 35.0–45.0)
Hemoglobin: 12.3 g/dL (ref 11.7–15.5)
Lymphs Abs: 1999 cells/uL (ref 850–3900)
MCH: 29.7 pg (ref 27.0–33.0)
MCHC: 34 g/dL (ref 32.0–36.0)
MCV: 87.4 fL (ref 80.0–100.0)
MONOS PCT: 6.4 %
MPV: 9.6 fL (ref 7.5–12.5)
Neutro Abs: 4925 cells/uL (ref 1500–7800)
Neutrophils Relative %: 64.8 %
Platelets: 312 10*3/uL (ref 140–400)
RBC: 4.14 10*6/uL (ref 3.80–5.10)
RDW: 12.6 % (ref 11.0–15.0)
Total Lymphocyte: 26.3 %
WBC: 7.6 10*3/uL (ref 3.8–10.8)
WBCMIX: 486 {cells}/uL (ref 200–950)

## 2018-09-04 LAB — LIPID PANEL
CHOLESTEROL: 177 mg/dL (ref <200)
HDL: 85 mg/dL (ref >50)
LDL Cholesterol (Calc): 73 mg/dL (calc)
Non-HDL Cholesterol (Calc): 92 mg/dL (calc) (ref <130)
TRIGLYCERIDES: 109 mg/dL (ref <150)
Total CHOL/HDL Ratio: 2.1 (calc) (ref <5.0)

## 2018-09-04 LAB — URINALYSIS, ROUTINE W REFLEX MICROSCOPIC
BILIRUBIN URINE: NEGATIVE
Glucose, UA: NEGATIVE
Hgb urine dipstick: NEGATIVE
Ketones, ur: NEGATIVE
Leukocytes, UA: NEGATIVE
Nitrite: NEGATIVE
PH: 7 (ref 5.0–8.0)
Protein, ur: NEGATIVE
SPECIFIC GRAVITY, URINE: 1.008 (ref 1.001–1.03)

## 2018-09-04 LAB — INSULIN, RANDOM: Insulin: 1.5 u[IU]/mL — ABNORMAL LOW (ref 2.0–19.6)

## 2018-09-04 LAB — HEMOGLOBIN A1C
EAG (MMOL/L): 5.5 (calc)
Hgb A1c MFr Bld: 5.1 % of total Hgb (ref <5.7)
MEAN PLASMA GLUCOSE: 100 (calc)

## 2018-09-04 LAB — VARICELLA ZOSTER ANTIBODY, IGG: VARICELLA IGG: 2585 {index}

## 2018-09-04 LAB — MAGNESIUM: Magnesium: 2 mg/dL (ref 1.5–2.5)

## 2018-09-04 LAB — MICROALBUMIN / CREATININE URINE RATIO: Creatinine, Urine: 17 mg/dL — ABNORMAL LOW (ref 20–275)

## 2018-09-04 LAB — VITAMIN D 25 HYDROXY (VIT D DEFICIENCY, FRACTURES): VIT D 25 HYDROXY: 44 ng/mL (ref 30–100)

## 2018-09-04 LAB — VITAMIN B12: VITAMIN B 12: 712 pg/mL (ref 200–1100)

## 2018-09-04 LAB — TSH: TSH: 2.67 mIU/L (ref 0.40–4.50)

## 2018-09-06 ENCOUNTER — Encounter: Payer: Self-pay | Admitting: Internal Medicine

## 2018-09-09 ENCOUNTER — Encounter: Payer: Self-pay | Admitting: Internal Medicine

## 2018-10-15 ENCOUNTER — Other Ambulatory Visit: Payer: Self-pay | Admitting: Internal Medicine

## 2018-10-15 MED ORDER — SULFACETAMIDE SODIUM-SULFUR 10-5 % EX SUSP: CUTANEOUS | 11 refills | Status: DC

## 2018-10-15 MED ORDER — ACYCLOVIR 5 % EX OINT: 1.0000 "application " | TOPICAL_OINTMENT | CUTANEOUS | 3 refills | Status: DC

## 2018-10-20 ENCOUNTER — Other Ambulatory Visit: Payer: Self-pay | Admitting: *Deleted

## 2018-10-20 MED ORDER — SULFACETAMIDE SODIUM (ACNE) 10 % EX LOTN: TOPICAL_LOTION | CUTANEOUS | 0 refills | Status: DC

## 2018-11-24 DIAGNOSIS — D485 Neoplasm of uncertain behavior of skin: Secondary | ICD-10-CM | POA: Diagnosis not present

## 2018-11-24 DIAGNOSIS — L820 Inflamed seborrheic keratosis: Secondary | ICD-10-CM | POA: Diagnosis not present

## 2019-02-02 ENCOUNTER — Encounter: Payer: Self-pay | Admitting: Internal Medicine

## 2019-02-22 ENCOUNTER — Encounter: Payer: PRIVATE HEALTH INSURANCE | Admitting: Family Medicine

## 2019-03-11 DIAGNOSIS — E2839 Other primary ovarian failure: Secondary | ICD-10-CM | POA: Insufficient documentation

## 2019-03-11 DIAGNOSIS — R232 Flushing: Secondary | ICD-10-CM | POA: Insufficient documentation

## 2019-03-11 DIAGNOSIS — R030 Elevated blood-pressure reading, without diagnosis of hypertension: Secondary | ICD-10-CM | POA: Insufficient documentation

## 2019-03-11 NOTE — Progress Notes (Addendum)
MEDICARE ANNUAL WELLNESS VISIT AND FOLLOW UP [phone] THIS ENCOUNTER IS A VIRTUAL/TELEVIDEO VISIT DUE TO COVID-19 - PATIENT WAS NOT SEEN IN THE OFFICE.  PATIENT HAS CONSENTED TO VIRTUAL VISIT / TELEVIDEO VISIT  This provider placed a call to Kimberly Murphy , her appointment was changed to a virtual office visit to reduce the risk of exposure to the COVID-19 virus and to help [employer] remain healthy and safe. The virtual visit will also provide continuity of care. She verbalizes understanding.   Assessment:   Encounter for Medicare annual wellness exam 1 year  Vitamin D deficiency Continue supplement  B12 deficiency Continue supplement  Irritable bowel syndrome, unspecified type If not on benefiber then add it, decrease stress,  if any worsening symptoms, blood in stool, AB pain, etc call office  Elevated BP without diagnosis of hypertension - continue medications, DASH diet, exercise and monitor at home. Call if greater than 130/80.   Estrogen deficiency Continue bASA and patch   Over 30 minutes of exam, counseling, chart review and critical decision making was performed Future Appointments  Date Time Provider [place]  03/29/2019  1:15 PM Emily Filbert, MD [facility]  09/07/2019 11:00 AM Unk Pinto, MD [facility] None     Plan:   During the course of the visit the patient was educated and counseled about appropriate screening and preventive services including:    Pneumococcal vaccine   Prevnar 13  Influenza vaccine  Td vaccine  Screening electrocardiogram  Bone densitometry screening  Colorectal cancer screening  Diabetes screening  Glaucoma screening  Nutrition counseling   Advanced directives: requested   Subjective:  Kimberly Murphy is a 68 y.o. female who presents for Medicare Annual Wellness Visit and 3 month follow up.   Father passed Sunday, 3 days ago, from Kimberly Murphy, dad 95 at [place]. States she is orphan now.  Upset but handling it well with her family as support.   Her blood pressure her BP is not checked at home, but she has a way and will monitor it. She does   workout, has been golfing. She denies chest pain, shortness of breath, dizziness.  She is not on cholesterol medication and denies myalgias. Her cholesterol is at goal. The cholesterol last visit was:   Lab Results  Component Value Date   CHOL 177 09/03/2018   HDL 85 09/03/2018   LDLCALC 73 09/03/2018   TRIG 109 09/03/2018   CHOLHDL 2.1 09/03/2018   Last A1C in the office was:  Lab Results  Component Value Date   HGBA1C 5.1 09/03/2018   Last GFR: Lab Results  Component Value Date   GFRNONAA 94 09/03/2018   Patient is on Vitamin D supplement.   Lab Results  Component Value Date   VD25OH 44 09/03/2018      Medication Review:  Current Outpatient Medications (Endocrine & Metabolic):  .  estradiol (VIVELLE-DOT) 0.0375 MG/24HR, Place 1 patch onto the skin 2 (two) times a week. .  progesterone (PROMETRIUM) 200 MG capsule, Take 200 mg by mouth daily. Takes for 12 days at the beginning of Every Other Month   Current Outpatient Medications (Respiratory):  .  loratadine (CLARITIN) 10 MG tablet, Take 1 tablet (10 mg total) by mouth daily as needed for allergies.  Current Outpatient Medications (Analgesics):  .  aspirin 81 MG tablet, Take 81 mg by mouth daily.  Current Outpatient Medications (Hematological):  Marland Kitchen  Cyanocobalamin (B-12) 1000 MCG SUBL, Place 1,000 mcg under the tongue daily.   Current  Outpatient Medications (Other):  .  acyclovir ointment (ZOVIRAX) 5 %, Apply 1 application topically every 3 (three) hours. Marland Kitchen  BIOTIN PO, Take 1 capsule by mouth daily. Marland Kitchen  CALCIUM CITRATE PO, Take 1 tablet by mouth daily.  .  Cholecalciferol (VITAMIN D-3) 5000 UNITS TABS, Take 2,500 Units by mouth daily. Marland Kitchen  dicyclomine (BENTYL) 10 MG capsule, Take 1 capsule (10 mg total) 3 (three) times daily as needed for up to 14 days by mouth for  spasms. .  Magnesium 250 MG TABS, Take 250 mg by mouth daily.  .  Melatonin 5 MG TABS, Take 1 tablet by mouth at bedtime. .  Sulfacetamide Sodium, Acne, 10 % LOTN, Apply 1 to 2 times daily.  Allergies Allergies  Allergen Reactions  . Pseudoephedrine-Dm-Gg     Increased Heart rate  . Seldane [Terfenadine]     Rapid palpitations  . Shellfish Allergy     N/V, diarrhea    Current Problems (verified) Patient Active Problem List   Diagnosis Date Noted  . Elevated BP without diagnosis of hypertension 03/11/2019  . Estrogen deficiency 03/11/2019  . Body mass index (BMI) of 20.0-20.9 in adult 09/23/2015  . IBS (irritable bowel syndrome)   . Vitamin D deficiency   . B12 deficiency     Screening Tests Immunization History  Administered Date(s) Administered  . DT 09/28/2014  . Influenza Inj Mdck Quad With Preservative 08/14/2017  . Influenza Split 09/28/2013, 09/28/2014  . Influenza, High Dose Seasonal PF 09/03/2018  . Influenza, Seasonal, Injecte, Preservative Fre 09/22/2015  . Influenza,inj,quad, With Preservative 09/27/2016  . PPD Test 09/28/2013, 08/14/2017  . Pneumococcal Conjugate-13 09/28/2014  . Pneumococcal Polysaccharide-23 09/03/2018  . Pneumococcal-Unspecified 11/11/1998  . Td 11/12/2003  . Zoster 11/11/2005   Preventative care: Last colonoscopy: 2009 Dr. Henrene Pastor Cologuard negative 08/2018 Last mammogram: 08/2018 Last pap smear/pelvic exam: 02/2018  DEXA: 08/2018 -1.2 osteopenia  Prior vaccinations: TD or Tdap: 2015  Influenza: 2019  Pneumococcal: 2015 Prevnar13: 2019 Shingles/Zostavax: 2007  Names of Other Physician/Practitioners you currently use: 1. [facility] here for primary care Patient Care Team: Unk Pinto, MD as PCP - General (Internal Medicine)  SURGICAL HISTORY She  has a past surgical history that includes Dilation and curettage of uterus ([place]). FAMILY HISTORY Her family history includes  Arthritis in her father; Cancer in her mother; Hypertension in her father; Macular degeneration in her father; Ulcerative colitis in her mother. SOCIAL HISTORY She  reports that she has never smoked. She has never used smokeless tobacco. She reports current alcohol use of about 14.0 standard drinks of alcohol per week.   MEDICARE WELLNESS OBJECTIVES: Physical activity: Current Exercise Habits: Home exercise routine, Type of exercise: walking;strength training/weights, Time (Minutes): 30, Frequency (Times/Week): 6, Weekly Exercise (Minutes/Week): 180, Intensity: Moderate Cardiac risk factors: Cardiac Risk Factors include: advanced age (>91men, >52 women) Depression/mood screen:   Depression screen [place] 2/9 03/12/2019  Decreased Interest 0  Down, Depressed, Hopeless 0  PHQ - 2 Score 0    ADLs:  In your present state of health, do you have any difficulty performing the following activities: 03/12/2019 09/03/2018  Hearing? N N  Vision? N N  Difficulty concentrating or making decisions? N N  Walking or climbing stairs? N N  Dressing or bathing? N N  Doing errands, shopping? N N  Some recent data might be hidden     Cognitive Testing  Alert? Yes  Normal Appearance?Yes  Oriented to person? Yes  Place? Yes  Time? Yes  Recall of three objects?  Yes  Can perform simple calculations? Yes  Displays appropriate judgment?Yes  Can read the correct time from a watch face?Yes  EOL planning: Does Patient Have a Medical Advance Directive?: Yes Type of Advance Directive: Healthcare Power of Attorney, Living will Does patient want to make changes to medical advance directive?: No - Patient declined Would patient like information on creating a medical advance directive?: No - Patient declined  Review of Systems  Constitutional: Negative.   HENT: Negative.   Eyes: Negative.   Respiratory: Negative.   Cardiovascular: Negative.   Gastrointestinal: Negative.   Genitourinary: Negative.    Musculoskeletal: Negative.   Skin: Negative.   Neurological: Negative.   Endo/Heme/Allergies: Negative.   Psychiatric/Behavioral: Negative.      Objective:     There were no vitals filed for this visit. There is no height or weight on file to calculate BMI.  General Appearance:Well sounding, in no apparent distress.  ENT/Mouth: No hoarseness, No cough for duration of visit.  Respiratory: completing full sentences without distress, without audible wheeze Neuro: Awake and oriented X 3,  Psych:  Insight and Judgment appropriate.    Medicare Attestation I have personally reviewed: The patient's medical and social history Their use of alcohol, tobacco or illicit drugs Their current medications and supplements The patient's functional ability including ADLs,fall risks, home safety risks, cognitive, and hearing and visual impairment Diet and physical activities Evidence for depression or mood disorders  The patient's weight, height, BMI, and visual acuity have been recorded in the chart.  I have made referrals, counseling, and provided education to the patient based on review of the above and I have provided the patient with a written personalized care plan for preventive services.     Vicie Mutters, PA-C   03/12/2019

## 2019-03-12 ENCOUNTER — Ambulatory Visit: Payer: PPO | Admitting: Adult Health

## 2019-03-12 ENCOUNTER — Ambulatory Visit: Payer: PPO | Admitting: Physician Assistant

## 2019-03-12 ENCOUNTER — Other Ambulatory Visit: Payer: Self-pay

## 2019-03-12 ENCOUNTER — Encounter: Payer: Self-pay | Admitting: Physician Assistant

## 2019-03-12 DIAGNOSIS — Z0001 Encounter for general adult medical examination with abnormal findings: Secondary | ICD-10-CM

## 2019-03-12 DIAGNOSIS — R030 Elevated blood-pressure reading, without diagnosis of hypertension: Secondary | ICD-10-CM | POA: Diagnosis not present

## 2019-03-12 DIAGNOSIS — K589 Irritable bowel syndrome without diarrhea: Secondary | ICD-10-CM | POA: Diagnosis not present

## 2019-03-12 DIAGNOSIS — E2839 Other primary ovarian failure: Secondary | ICD-10-CM | POA: Diagnosis not present

## 2019-03-12 DIAGNOSIS — E538 Deficiency of other specified B group vitamins: Secondary | ICD-10-CM

## 2019-03-12 DIAGNOSIS — R6889 Other general symptoms and signs: Secondary | ICD-10-CM

## 2019-03-12 DIAGNOSIS — E559 Vitamin D deficiency, unspecified: Secondary | ICD-10-CM

## 2019-03-12 DIAGNOSIS — Z0000 Encounter for general adult medical examination without abnormal findings: Secondary | ICD-10-CM

## 2019-03-20 MED ORDER — HYOSCYAMINE SULFATE 0.125 MG PO TABS: 0.1250 mg | ORAL_TABLET | ORAL | 2 refills | Status: DC | PRN

## 2019-03-29 ENCOUNTER — Encounter: Payer: PRIVATE HEALTH INSURANCE | Admitting: Obstetrics & Gynecology

## 2019-04-19 ENCOUNTER — Encounter: Payer: Self-pay | Admitting: Radiology

## 2019-04-29 DIAGNOSIS — D1801 Hemangioma of skin and subcutaneous tissue: Secondary | ICD-10-CM | POA: Diagnosis not present

## 2019-04-29 DIAGNOSIS — L821 Other seborrheic keratosis: Secondary | ICD-10-CM | POA: Diagnosis not present

## 2019-04-29 DIAGNOSIS — D2272 Melanocytic nevi of left lower limb, including hip: Secondary | ICD-10-CM | POA: Diagnosis not present

## 2019-04-29 DIAGNOSIS — L298 Other pruritus: Secondary | ICD-10-CM | POA: Diagnosis not present

## 2019-04-29 DIAGNOSIS — D2271 Melanocytic nevi of right lower limb, including hip: Secondary | ICD-10-CM | POA: Diagnosis not present

## 2019-05-03 ENCOUNTER — Ambulatory Visit (INDEPENDENT_AMBULATORY_CARE_PROVIDER_SITE_OTHER): Payer: PPO | Admitting: Obstetrics & Gynecology

## 2019-05-03 ENCOUNTER — Encounter: Payer: Self-pay | Admitting: Obstetrics & Gynecology

## 2019-05-03 ENCOUNTER — Other Ambulatory Visit: Payer: Self-pay

## 2019-05-03 ENCOUNTER — Other Ambulatory Visit (HOSPITAL_COMMUNITY)
Admission: RE | Admit: 2019-05-03 | Discharge: 2019-05-03 | Disposition: A | Discharge status: Home / Self Care | Payer: PPO | Source: Ambulatory Visit | Attending: Obstetrics & Gynecology | Admitting: Obstetrics & Gynecology

## 2019-05-03 VITALS — BP 119/76 | HR 76 | Ht 66.0 in | Wt 129.0 lb

## 2019-05-03 DIAGNOSIS — Z01419 Encounter for gynecological examination (general) (routine) without abnormal findings: Secondary | ICD-10-CM | POA: Insufficient documentation

## 2019-05-03 DIAGNOSIS — Z1151 Encounter for screening for human papillomavirus (HPV): Secondary | ICD-10-CM | POA: Diagnosis not present

## 2019-05-03 DIAGNOSIS — R87610 Atypical squamous cells of undetermined significance on cytologic smear of cervix (ASC-US): Secondary | ICD-10-CM

## 2019-05-03 DIAGNOSIS — R786 Finding of steroid agent in blood: Secondary | ICD-10-CM | POA: Diagnosis not present

## 2019-05-03 NOTE — Progress Notes (Signed)
Subjective:    Kimberly Murphy is a 68 y.o. married P1 (26yo son) who presents for an annual exam. The patient has no complaints today. The patient is sexually active. GYN screening history: last pap: as ASCUS. The patient wears seatbelts: yes. The patient participates in regular exercise: yes. (golfing) Has the patient ever been transfused or tattooed?: no. The patient reports that there is not domestic violence in her life.   Menstrual History: OB History    Gravida  5   Para  2   Term  1   Preterm  1   AB  3   Living  1     SAB  3   TAB      Ectopic      Multiple      Live Births  2           Menarche age: 23 No LMP recorded. Patient is postmenopausal.    The following portions of the patient's history were reviewed and updated as appropriate: allergies, current medications, past family history, past medical history, past social history, past surgical history and problem list.  Review of Systems Pertinent items are noted in HPI.   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 UTD   Objective:    BP 119/76   Pulse 76   Ht 5\' 6"  (1.676 m)   Wt 129 lb (58.5 kg)   BMI 20.82 kg/m   General Appearance:    Alert, cooperative, no distress, appears stated age  Head:    Normocephalic, without obvious abnormality, atraumatic  Eyes:    PERRL, conjunctiva/corneas clear, EOM's intact, fundi    benign, both eyes  Ears:    Normal TM's and external ear canals, both ears  Nose:   Nares normal, septum midline, mucosa normal, no drainage    or sinus tenderness  Throat:   Lips, mucosa, and tongue normal; teeth and gums normal  Neck:   Supple, symmetrical, trachea midline, no adenopathy;    thyroid:  no enlargement/tenderness/nodules; no carotid   bruit or JVD  Back:     Symmetric, no curvature, ROM normal, no CVA  tenderness  Lungs:     Clear to auscultation bilaterally, respirations unlabored  Chest Wall:    No tenderness or deformity   Heart:    Regular rate and rhythm, S1 and S2 normal, no murmur, rub   or gallop  Breast Exam:    No tenderness, masses, or nipple abnormality  Abdomen:     Soft, non-tender, bowel sounds active all four quadrants,    no masses, no organomegaly  Genitalia:    Normal female without lesion, discharge or tenderness, normal size and shape, retroverted, mobile, non-tender, normal adnexal exam      Extremities:   Extremities normal, atraumatic, no cyanosis or edema  Pulses:   2+ and symmetric all extremities  Skin:   Skin color, texture, turgor normal, no rashes or lesions  Lymph nodes:   Cervical, supraclavicular, and axillary nodes normal  Neurologic:   CNII-XII intact, normal strength, sensation and reflexes    throughout  .    Assessment:    Healthy female exam.    Plan:     Thin prep Pap smear. with cotesting Continue HRT prn

## 2019-05-03 NOTE — Progress Notes (Signed)
Last pap 02/17/2018 Mammo 08/2018

## 2019-05-05 LAB — CYTOLOGY - PAP
Diagnosis: NEGATIVE
HPV: NOT DETECTED

## 2019-06-08 ENCOUNTER — Other Ambulatory Visit: Payer: Self-pay | Admitting: *Deleted

## 2019-06-08 MED ORDER — PROGESTERONE MICRONIZED 200 MG PO CAPS: 200.0000 mg | ORAL_CAPSULE | Freq: Every day | ORAL | 3 refills | Status: DC

## 2019-06-08 MED ORDER — ESTRADIOL 0.0375 MG/24HR TD PTTW: 1.0000 | MEDICATED_PATCH | TRANSDERMAL | 11 refills | Status: DC

## 2019-06-24 ENCOUNTER — Other Ambulatory Visit: Payer: Self-pay

## 2019-06-24 ENCOUNTER — Other Ambulatory Visit: Payer: Self-pay | Admitting: Internal Medicine

## 2019-06-24 ENCOUNTER — Encounter: Payer: Self-pay | Admitting: Internal Medicine

## 2019-06-24 ENCOUNTER — Other Ambulatory Visit: Payer: PPO

## 2019-06-24 DIAGNOSIS — Z800 Family history of malignant neoplasm of digestive organs: Secondary | ICD-10-CM | POA: Diagnosis not present

## 2019-06-24 DIAGNOSIS — Z01419 Encounter for gynecological examination (general) (routine) without abnormal findings: Secondary | ICD-10-CM

## 2019-06-24 DIAGNOSIS — Z1231 Encounter for screening mammogram for malignant neoplasm of breast: Secondary | ICD-10-CM

## 2019-06-24 NOTE — Progress Notes (Unsigned)
gen

## 2019-07-16 ENCOUNTER — Telehealth: Payer: Self-pay

## 2019-07-16 NOTE — Telephone Encounter (Signed)
Patient left a message she would like her most recent pap change to follow up on pap due to her insurance not wanting to cover this visit. Message has been sent to Glass blower/designer.

## 2019-08-23 ENCOUNTER — Other Ambulatory Visit: Payer: Self-pay

## 2019-08-23 ENCOUNTER — Ambulatory Visit (INDEPENDENT_AMBULATORY_CARE_PROVIDER_SITE_OTHER): Payer: PPO

## 2019-08-23 ENCOUNTER — Ambulatory Visit
Admission: RE | Admit: 2019-08-23 | Discharge: 2019-08-23 | Disposition: A | Discharge status: Home / Self Care | Payer: PPO | Source: Ambulatory Visit | Attending: Internal Medicine | Admitting: Internal Medicine

## 2019-08-23 ENCOUNTER — Telehealth: Payer: Self-pay | Admitting: *Deleted

## 2019-08-23 VITALS — Temp 97.2°F

## 2019-08-23 DIAGNOSIS — Z1231 Encounter for screening mammogram for malignant neoplasm of breast: Secondary | ICD-10-CM

## 2019-08-23 DIAGNOSIS — Z23 Encounter for immunization: Secondary | ICD-10-CM | POA: Diagnosis not present

## 2019-08-23 NOTE — Telephone Encounter (Signed)
Patient returned to the office after having her flu vaccine in her left upper arm.  The area was swollen and bruised at the injection site.  The patient was offered an RX for Prednisone, which she refused.  She was advised to apply ice to the area and take Advil or Tylenol.  Vicie Mutters, PA, is aware.

## 2019-08-23 NOTE — Progress Notes (Signed)
REPORTS FOR HIGH DOSE FLU  Patient had to LAY down during vaccine which she did with no issues.

## 2019-09-06 ENCOUNTER — Encounter: Payer: Self-pay | Admitting: Internal Medicine

## 2019-09-06 NOTE — Progress Notes (Signed)
Annual Screening/Preventative Visit & Comprehensive Evaluation &  Examination     This very nice 68 y.o. MWF  presents for a Screening /Preventative Visit & comprehensive evaluation and management of multiple medical co-morbidities.  Patient has been followed for HTN, HLD, Prediabetes and Vitamin D Deficiency.     [[ COPIED from Oct 2019 - Patient has a very remote hx/o Galactorrhea and elevated Prolactin levels and a ? of a Pituitary Microadenoma and in 2010 Dr Ellene Route felt that her previous MRI's had been over interpreted and that she did not have a Pituitary Adenoma.She has had annual Visual Field screening by Dr Claudean Kinds for years]]      In 2016 , patient had a borderline elevated BP 130/86 and has been followed expectantly.  Patient's BP has been controlled at home and patient denies any cardiac symptoms as chest pain, palpitations, shortness of breath, dizziness or ankle swelling. Today's BP is at goal - 124/82.      Patient's lipids have beencontrolled with diet. Last lipids were at goal:  Lab Results  Component Value Date   CHOL 177 09/03/2018   HDL 85 09/03/2018   LDLCALC 73 09/03/2018   TRIG 109 09/03/2018   CHOLHDL 2.1 09/03/2018      Patient has been monitored expectantly for glucose intolerance & prediabetes  and patient denies reactive hypoglycemic symptoms, visual blurring, diabetic polys or paresthesias. Last A1c was Normal & at goal:  Lab Results  Component Value Date   HGBA1C 5.1 09/03/2018      Finally, patient has history of Vitamin D Deficiency and last Vitamin D was still low (goal 70-100):   Lab Results  Component Value Date   VD25OH 44 09/03/2018   Current Outpatient Medications on File Prior to Visit  Medication Sig  . acyclovir ointment (ZOVIRAX) 5 % Apply 1 application topically every 3 (three) hours.  Marland Kitchen aspirin 81 MG tablet Take 81 mg by mouth daily.  Marland Kitchen BIOTIN PO Take 1 capsule by mouth daily.  Marland Kitchen CALCIUM CITRATE PO Take 1 tablet by mouth daily.   .  Cholecalciferol (VITAMIN D-3) 5000 UNITS TABS Take 2,500 Units by mouth daily.  . Cyanocobalamin (B-12) 1000 MCG SUBL Place 1,000 mcg under the tongue daily.   Marland Kitchen estradiol (VIVELLE-DOT) 0.0375 MG/24HR Place 1 patch onto the skin 2 (two) times a week.  . hyoscyamine (LEVSIN) 0.125 MG tablet Take 1 tablet (0.125 mg total) by mouth every 4 (four) hours as needed for cramping (diarrhea, nausea).  . loratadine (CLARITIN) 10 MG tablet Take 1 tablet (10 mg total) by mouth daily as needed for allergies.  . Magnesium 250 MG TABS Take 250 mg by mouth daily.   . Melatonin 5 MG TABS Take 1 tablet by mouth at bedtime.  . progesterone (PROMETRIUM) 200 MG capsule Take 1 capsule (200 mg total) by mouth daily. Takes for 12 days at the beginning of Every Other Month  . Sulfacetamide Sodium, Acne, 10 % LOTN Apply 1 to 2 times daily.   No current facility-administered medications on file prior to visit.    Allergies  Allergen Reactions  . Pseudoephedrine-Dm-Gg     Increased Heart rate  . Seldane [Terfenadine]     Rapid palpitations  . Shellfish Allergy     N/V, diarrhea   Past Medical History:  Diagnosis Date  . Allergy   . Aphthous stomatitis   . B12 deficiency   . Galactorrhea    with elevated Prolactin levels  . Hemorrhoid   . HSV-1 (  herpes simplex virus 1) infection   . IBS (irritable bowel syndrome)   . Pituitary microadenoma ([place])   . Synovitis of finger 2003   Left ring finger  . Tennis elbow 2007   Right elbow  . Vitamin D deficiency    Health Maintenance  Topic Date Due  . MAMMOGRAM  08/22/2020  . Fecal DNA (Cologuard)  08/14/2021  . TETANUS/TDAP  09/28/2024  . INFLUENZA VACCINE  Completed  . DEXA SCAN  Completed  . Hepatitis C Screening  Completed  . PNA vac Low Risk Adult  Completed   Immunization History  Administered Date(s) Administered  . DT 09/28/2014  . Influenza Inj Mdck Quad With Preservative 08/14/2017  . Influenza Split 09/28/2013, 09/28/2014  . Influenza, High  Dose Seasonal PF 09/03/2018, 08/23/2019  . Influenza, Seasonal, Injecte, Preservative Fre 09/22/2015  . Influenza,inj,quad, With Preservative 09/27/2016  . PPD Test 09/28/2013, 08/14/2017  . Pneumococcal Conjugate-13 09/28/2014  . Pneumococcal Polysaccharide-23 09/03/2018  . Pneumococcal-Unspecified 11/11/1998  . Td 11/12/2003  . Zoster 11/11/2005   Last Colon - 06/21/2008 - Dr Henrene Pastor - recc 10 year f/u  Last MGM - 08/23/2019   Past Surgical History:  Procedure Laterality Date  . DILATION AND CURETTAGE OF UTERUS  1994 & 1996   Family History  Problem Relation Age of Onset  . Cancer Mother        Breast  . Ulcerative colitis Mother   . Breast cancer Mother   . Arthritis Father   . Hypertension Father   . Macular degeneration Father    Social History   Tobacco Use  . Smoking status: Never Smoker  . Smokeless tobacco: Never Used  Substance Use Topics  . Alcohol use: Yes    Alcohol/week: 14.0 standard drinks    Types: 14 Standard drinks or equivalent per week  . Drug use: Not Currently    ROS Constitutional: Denies fever, chills, weight loss/gain, headaches, insomnia,  night sweats, and change in appetite. Does c/o fatigue. Eyes: Denies redness, blurred vision, diplopia, discharge, itchy, watery eyes.  ENT: Denies discharge, congestion, post nasal drip, epistaxis, sore throat, earache, hearing loss, dental pain, Tinnitus, Vertigo, Sinus pain, snoring.  Cardio: Denies chest pain, palpitations, irregular heartbeat, syncope, dyspnea, diaphoresis, orthopnea, PND, claudication, edema Respiratory: denies cough, dyspnea, DOE, pleurisy, hoarseness, laryngitis, wheezing.  Gastrointestinal: Denies dysphagia, heartburn, reflux, water brash, pain, cramps, nausea, vomiting, bloating, diarrhea, constipation, hematemesis, melena, hematochezia, jaundice, hemorrhoids Genitourinary: Denies dysuria, frequency, urgency, nocturia, hesitancy, discharge, hematuria, flank pain Breast: Breast lumps,  nipple discharge, bleeding.  Musculoskeletal: Denies arthralgia, myalgia, stiffness, Jt. Swelling, pain, limp, and strain/sprain. Denies falls. Skin: Denies puritis, rash, hives, warts, acne, eczema, changing in skin lesion Neuro: No weakness, tremor, incoordination, spasms, paresthesia, pain Psychiatric: Denies confusion, memory loss, sensory loss. Denies Depression. Endocrine: Denies change in weight, skin, hair change, nocturia, and paresthesia, diabetic polys, visual blurring, hyper / hypo glycemic episodes.  Heme/Lymph: No excessive bleeding, bruising, enlarged lymph nodes.  Physical Exam  BP 124/82   Pulse 80   Temp 97.6 F (36.4 C)   Resp 16   Ht 5\' 7"  (1.702 m)   Wt 131 lb (59.4 kg)   BMI 20.52 kg/m   General Appearance: Well nourished, well groomed and in no apparent distress.  Eyes: PERRLA, EOMs, conjunctiva no swelling or erythema, normal fundi and vessels. Sinuses: No frontal/maxillary tenderness ENT/Mouth: EACs patent / TMs  nl. Nares clear without erythema, swelling, mucoid exudates. Oral hygiene is good. No erythema, swelling, or exudate. Tongue  normal, non-obstructing. Tonsils not swollen or erythematous. Hearing normal.  Neck: Supple, thyroid not palpable. No bruits, nodes or JVD. Respiratory: Respiratory effort normal.  BS equal and clear bilateral without rales, rhonci, wheezing or stridor. Cardio: Heart sounds are normal with regular rate and rhythm and no murmurs, rubs or gallops. Peripheral pulses are normal and equal bilaterally without edema. No aortic or femoral bruits. Chest: symmetric with normal excursions and percussion. Breasts: Symmetric, without lumps, nipple discharge, retractions, or fibrocystic changes.  Abdomen: Flat, soft with bowel sounds active. Nontender, no guarding, rebound, hernias, masses, or organomegaly.  Lymphatics: Non tender without lymphadenopathy.  Genitourinary:  Musculoskeletal: Full ROM all peripheral extremities, joint stability,  5/5 strength, and normal gait. Skin: Warm and dry without rashes, lesions, cyanosis, clubbing or  ecchymosis.  Neuro: Cranial nerves intact, reflexes equal bilaterally. Normal muscle tone, no cerebellar symptoms. Sensation intact.  Pysch: Alert and oriented X 3, normal affect, Insight and Judgment appropriate.   Assessment and Plan  1. Annual Preventative Screening Examination  2. Elevated BP without diagnosis of hypertension  - EKG 12-Lead - Urinalysis, Routine w reflex microscopic - Microalbumin / creatinine urine ratio - CBC with Differential/Platelet - COMPLETE METABOLIC PANEL WITH GFR - Magnesium - TSH  3. Abnormal glucose  - EKG 12-Lead - Hemoglobin A1c - Insulin, random  4. Vitamin D deficiency  - VITAMIN D 25 Hydroxyl  5. Seasonal allergic rhinitis due to pollen   6. Screening for colorectal cancer  - POC Hemoccult Bld/Stl  7. Lipid screening  - Lipid panel - TSH  8. Screening for ischemic heart disease  - EKG 12-Lead  9. FH: hypertension  - EKG 12-Lead  10. Medication management  - Urinalysis, Routine w reflex microscopic - Microalbumin / creatinine urine ratio - CBC with Differential/Platelet - COMPLETE METABOLIC PANEL WITH GFR - Magnesium - Lipid panel - TSH - Hemoglobin A1c - Insulin, random - VITAMIN D 25 Hydroxyl  11. Non-seasonal allergic rhinitis due to pollen  - montelukast (SINGULAIR) 10 MG tablet; Take 1 tablet daily for Allergies & Asthma  Dispense: 90 tablet; Refill: 3       Patient was counseled in prudent diet to achieve/maintain BMI less than 25 for weight control, BP monitoring, regular exercise and medications. Discussed med's effects and SE's. Screening labs and tests as requested with regular follow-up as recommended. Over 40 minutes of exam, counseling, chart review and high complex critical decision making was performed.   Kirtland Bouchard, MD

## 2019-09-06 NOTE — Patient Instructions (Signed)

## 2019-09-07 ENCOUNTER — Other Ambulatory Visit: Payer: Self-pay

## 2019-09-07 ENCOUNTER — Ambulatory Visit (INDEPENDENT_AMBULATORY_CARE_PROVIDER_SITE_OTHER): Payer: PPO | Admitting: Internal Medicine

## 2019-09-07 VITALS — BP 124/82 | HR 80 | Temp 97.6°F | Resp 16 | Ht 67.0 in | Wt 131.0 lb

## 2019-09-07 DIAGNOSIS — Z13220 Encounter for screening for lipoid disorders: Secondary | ICD-10-CM

## 2019-09-07 DIAGNOSIS — Z8249 Family history of ischemic heart disease and other diseases of the circulatory system: Secondary | ICD-10-CM | POA: Diagnosis not present

## 2019-09-07 DIAGNOSIS — Z0001 Encounter for general adult medical examination with abnormal findings: Secondary | ICD-10-CM

## 2019-09-07 DIAGNOSIS — R030 Elevated blood-pressure reading, without diagnosis of hypertension: Secondary | ICD-10-CM

## 2019-09-07 DIAGNOSIS — Z136 Encounter for screening for cardiovascular disorders: Secondary | ICD-10-CM

## 2019-09-07 DIAGNOSIS — R7309 Other abnormal glucose: Secondary | ICD-10-CM | POA: Diagnosis not present

## 2019-09-07 DIAGNOSIS — E559 Vitamin D deficiency, unspecified: Secondary | ICD-10-CM | POA: Diagnosis not present

## 2019-09-07 DIAGNOSIS — Z0000 Encounter for general adult medical examination without abnormal findings: Secondary | ICD-10-CM

## 2019-09-07 DIAGNOSIS — Z1211 Encounter for screening for malignant neoplasm of colon: Secondary | ICD-10-CM

## 2019-09-07 DIAGNOSIS — Z79899 Other long term (current) drug therapy: Secondary | ICD-10-CM | POA: Diagnosis not present

## 2019-09-07 DIAGNOSIS — J301 Allergic rhinitis due to pollen: Secondary | ICD-10-CM

## 2019-09-07 DIAGNOSIS — Z1212 Encounter for screening for malignant neoplasm of rectum: Secondary | ICD-10-CM

## 2019-09-07 MED ORDER — MONTELUKAST SODIUM 10 MG PO TABS: ORAL_TABLET | ORAL | 3 refills | Status: DC

## 2019-09-08 LAB — URINALYSIS, ROUTINE W REFLEX MICROSCOPIC
Bilirubin Urine: NEGATIVE
Glucose, UA: NEGATIVE
Hgb urine dipstick: NEGATIVE
Ketones, ur: NEGATIVE
Leukocytes,Ua: NEGATIVE
Nitrite: NEGATIVE
Protein, ur: NEGATIVE
Specific Gravity, Urine: 1.006 (ref 1.001–1.03)
pH: 7.5 (ref 5.0–8.0)

## 2019-09-08 LAB — CBC WITH DIFFERENTIAL/PLATELET
Absolute Monocytes: 456 cells/uL (ref 200–950)
Basophils Absolute: 40 cells/uL (ref 0–200)
Basophils Relative: 0.7 %
Eosinophils Absolute: 91 cells/uL (ref 15–500)
Eosinophils Relative: 1.6 %
HCT: 37.1 % (ref 35.0–45.0)
Hemoglobin: 12.4 g/dL (ref 11.7–15.5)
Lymphs Abs: 1442 cells/uL (ref 850–3900)
MCH: 30 pg (ref 27.0–33.0)
MCHC: 33.4 g/dL (ref 32.0–36.0)
MCV: 89.6 fL (ref 80.0–100.0)
MPV: 9.4 fL (ref 7.5–12.5)
Monocytes Relative: 8 %
Neutro Abs: 3671 cells/uL (ref 1500–7800)
Neutrophils Relative %: 64.4 %
Platelets: 293 10*3/uL (ref 140–400)
RBC: 4.14 10*6/uL (ref 3.80–5.10)
RDW: 12.7 % (ref 11.0–15.0)
Total Lymphocyte: 25.3 %
WBC: 5.7 10*3/uL (ref 3.8–10.8)

## 2019-09-08 LAB — COMPLETE METABOLIC PANEL WITH GFR
AG Ratio: 2 (calc) (ref 1.0–2.5)
ALT: 16 U/L (ref 6–29)
AST: 17 U/L (ref 10–35)
Albumin: 4.5 g/dL (ref 3.6–5.1)
Alkaline phosphatase (APISO): 42 U/L (ref 37–153)
BUN: 18 mg/dL (ref 7–25)
CO2: 29 mmol/L (ref 20–32)
Calcium: 9.9 mg/dL (ref 8.6–10.4)
Chloride: 100 mmol/L (ref 98–110)
Creat: 0.66 mg/dL (ref 0.50–0.99)
GFR, Est African American: 107 mL/min/{1.73_m2} (ref >=60)
GFR, Est Non African American: 92 mL/min/{1.73_m2} (ref >=60)
Globulin: 2.3 g/dL (calc) (ref 1.9–3.7)
Glucose, Bld: 98 mg/dL (ref 65–99)
Potassium: 4.4 mmol/L (ref 3.5–5.3)
Sodium: 138 mmol/L (ref 135–146)
Total Bilirubin: 0.4 mg/dL (ref 0.2–1.2)
Total Protein: 6.8 g/dL (ref 6.1–8.1)

## 2019-09-08 LAB — MICROALBUMIN / CREATININE URINE RATIO
Creatinine, Urine: 11 mg/dL — ABNORMAL LOW (ref 20–275)
Microalb, Ur: <0.2 mg/dL

## 2019-09-08 LAB — LIPID PANEL
Cholesterol: 173 mg/dL (ref <200)
HDL: 91 mg/dL (ref >=50)
LDL Cholesterol (Calc): 64 mg/dL (calc)
Non-HDL Cholesterol (Calc): 82 mg/dL (calc) (ref <130)
Total CHOL/HDL Ratio: 1.9 (calc) (ref <5.0)
Triglycerides: 93 mg/dL (ref <150)

## 2019-09-08 LAB — MAGNESIUM: Magnesium: 2.1 mg/dL (ref 1.5–2.5)

## 2019-09-08 LAB — HEMOGLOBIN A1C
Hgb A1c MFr Bld: 4.9 % of total Hgb (ref <5.7)
Mean Plasma Glucose: 94 (calc)
eAG (mmol/L): 5.2 (calc)

## 2019-09-08 LAB — VITAMIN D 25 HYDROXY (VIT D DEFICIENCY, FRACTURES): Vit D, 25-Hydroxy: 67 ng/mL (ref 30–100)

## 2019-09-08 LAB — TSH: TSH: 4.03 mIU/L (ref 0.40–4.50)

## 2019-09-08 LAB — INSULIN, RANDOM: Insulin: 1.9 u[IU]/mL

## 2019-09-25 ENCOUNTER — Encounter (HOSPITAL_COMMUNITY): Payer: Self-pay

## 2019-09-25 ENCOUNTER — Other Ambulatory Visit: Payer: Self-pay

## 2019-09-25 ENCOUNTER — Ambulatory Visit (HOSPITAL_COMMUNITY)
Admission: EM | Admit: 2019-09-25 | Discharge: 2019-09-25 | Disposition: A | Discharge status: Home / Self Care | Payer: PPO | Attending: Internal Medicine | Admitting: Internal Medicine

## 2019-09-25 DIAGNOSIS — Z20822 Contact with and (suspected) exposure to covid-19: Secondary | ICD-10-CM

## 2019-09-25 DIAGNOSIS — Z20828 Contact with and (suspected) exposure to other viral communicable diseases: Secondary | ICD-10-CM | POA: Insufficient documentation

## 2019-09-25 NOTE — ED Provider Notes (Addendum)
[place]    CSN: [id number] Arrival date & time: 09/25/19  1724      History   Chief Complaint Chief Complaint  Patient presents with  . covid test    HPI Kimberly Murphy is a 68 y.o. female.   Patient is a 68 year old female that presents today for Covid testing.  Reporting she was exposed by a family member in close contact.  She denies any symptoms.  Is concerned due to her husband future cataract surgery.      Past Medical History:  Diagnosis Date  . Allergy   . Aphthous stomatitis   . B12 deficiency   . Galactorrhea    with elevated Prolactin levels  . Hemorrhoid   . HSV-1 (herpes simplex virus 1) infection   . IBS (irritable bowel syndrome)   . Pituitary microadenoma ([place])   . Synovitis of finger 2003   Left ring finger  . Tennis elbow 2007   Right elbow  . Vitamin D deficiency     Patient Active Problem List   Diagnosis Date Noted  . Elevated BP without diagnosis of hypertension 03/11/2019  . Estrogen deficiency 03/11/2019  . Body mass index (BMI) of 20.0-20.9 in adult 09/23/2015  . IBS (irritable bowel syndrome)   . Vitamin D deficiency   . B12 deficiency     Past Surgical History:  Procedure Laterality Date  . DILATION AND CURETTAGE OF UTERUS  1994 & 1996    OB History    Gravida  5   Para  2   Term  1   Preterm  1   AB  3   Living  1     SAB  3   TAB      Ectopic      Multiple      Live Births  2            Home Medications    Prior to Admission medications   Medication Sig Start Date End Date Taking? Authorizing Provider  acyclovir ointment (ZOVIRAX) 5 % Apply 1 application topically every 3 (three) hours. 10/15/18   Unk Pinto, MD  aspirin 81 MG tablet Take 81 mg by mouth daily.    [provider]  BIOTIN PO Take 1 capsule by mouth daily.    [provider]  CALCIUM CITRATE PO Take 1 tablet by mouth daily.     [provider]  Cholecalciferol (VITAMIN D-3)  5000 UNITS TABS Take 2,500 Units by mouth daily.    [provider]  Cyanocobalamin (B-12) 1000 MCG SUBL Place 1,000 mcg under the tongue daily.     [provider]  estradiol (VIVELLE-DOT) 0.0375 MG/24HR Place 1 patch onto the skin 2 (two) times a week. 06/10/19   Emily Filbert, MD  hyoscyamine (LEVSIN) 0.125 MG tablet Take 1 tablet (0.125 mg total) by mouth every 4 (four) hours as needed for cramping (diarrhea, nausea). 03/20/19   Vicie Mutters, PA-C  loratadine (CLARITIN) 10 MG tablet Take 1 tablet (10 mg total) by mouth daily as needed for allergies. 10/01/14   Unk Pinto, MD  Magnesium 250 MG TABS Take 250 mg by mouth daily.     [provider]  Melatonin 5 MG TABS Take 1 tablet by mouth at bedtime.    [provider]  montelukast (SINGULAIR) 10 MG tablet Take 1 tablet daily for Allergies & Asthma 09/07/19   Unk Pinto, MD  progesterone (PROMETRIUM) 200 MG capsule Take 1 capsule (  200 mg total) by mouth daily. Takes for 12 days at the beginning of Every Other Month 06/08/19   Emily Filbert, MD  Sulfacetamide Sodium, Acne, 10 % LOTN Apply 1 to 2 times daily. 10/20/18   Unk Pinto, MD    Family History Family History  Problem Relation Age of Onset  . Cancer Mother        Breast  . Ulcerative colitis Mother   . Breast cancer Mother   . Arthritis Father   . Hypertension Father   . Macular degeneration Father     Social History Social History   Tobacco Use  . Smoking status: Never Smoker  . Smokeless tobacco: Never Used  Substance Use Topics  . Alcohol use: Yes    Alcohol/week: 14.0 standard drinks    Types: 14 Standard drinks or equivalent per week  . Drug use: Not Currently     Allergies   Pseudoephedrine-dm-gg, Seldane [terfenadine], and Shellfish allergy   Review of Systems Review of Systems  Constitutional: Negative.   HENT: Negative.   Respiratory: Negative.   Musculoskeletal: Negative.   Skin: Negative.       Physical Exam Triage Vital Signs ED Triage Vitals  Enc Vitals Group     BP 09/25/19 1810 (!) 151/84     Pulse Rate 09/25/19 1806 73     Resp 09/25/19 1806 16     Temp 09/25/19 1806 98.1 F (36.7 C)     Temp Source 09/25/19 1806 Oral     SpO2 09/25/19 1806 99 %     Weight --      Height --      Head Circumference --      Peak Flow --      Pain Score 09/25/19 1810 0     Pain Loc --      Pain Edu? --      Excl. in [place]? --    No data found.  Updated Vital Signs BP (!) 151/84 (BP Location: Left Arm)   Pulse 73   Temp 98.1 F (36.7 C) (Oral)   Resp 16   SpO2 99%   Visual Acuity Right Eye Distance:   Left Eye Distance:   Bilateral Distance:    Right Eye Near:   Left Eye Near:    Bilateral Near:     Physical Exam Vitals signs and nursing note reviewed.  Constitutional:      General: She is not in acute distress.    Appearance: Normal appearance. She is not ill-appearing, toxic-appearing or diaphoretic.  HENT:     Head: Normocephalic.     Nose: Nose normal.     Mouth/Throat:     Pharynx: Oropharynx is clear.  Eyes:     Conjunctiva/sclera: Conjunctivae normal.  Neck:     Musculoskeletal: Normal range of motion.  Pulmonary:     Effort: Pulmonary effort is normal.  Musculoskeletal: Normal range of motion.  Skin:    General: Skin is warm and dry.     Findings: No rash.  Neurological:     Mental Status: She is alert.  Psychiatric:        Mood and Affect: Mood normal.      UC Treatments / Results  Labs (all labs ordered are listed, but only abnormal results are displayed) Labs Reviewed  NOVEL CORONAVIRUS, NAA (HOSP ORDER, SEND-OUT TO REF LAB; TAT 18-24 HRS)    EKG   Radiology No results found.  Procedures Procedures (including critical care time)  Medications  Ordered in UC Medications - No data to display  Initial Impression / Assessment and Plan / UC Course  I have reviewed the triage vital signs and the nursing notes.  Pertinent labs &  imaging results that were available during my care of the patient were reviewed by me and considered in my medical decision making (see chart for details).     Exposure to Covid-swab sent for testing and quarantine precautions given until we receive results. Patient understanding and agree. Final Clinical Impressions(s) / UC Diagnoses   Final diagnoses:  Exposure to COVID-19 virus     Discharge Instructions     We will call you if the COVID test is positive You can also check your my chart for results.     ED Prescriptions    None     PDMP not reviewed this encounter.   Orvan July, NP 09/27/19 0825    Orvan July, NP 09/27/19 [phone]

## 2019-09-25 NOTE — ED Triage Notes (Signed)
Pt presents to UC stating she believes she was exposed to covid 1 week ago. Pt has not had any new symptoms. She states she always has "the sniffles" and slight cough from allergies.

## 2019-09-25 NOTE — Discharge Instructions (Addendum)
We will call you if the COVID test is positive You can also check your my chart for results.

## 2019-09-27 LAB — NOVEL CORONAVIRUS, NAA (HOSP ORDER, SEND-OUT TO REF LAB; TAT 18-24 HRS): SARS-CoV-2, NAA: NOT DETECTED

## 2019-10-06 ENCOUNTER — Encounter: Payer: Self-pay | Admitting: Internal Medicine

## 2019-12-20 ENCOUNTER — Ambulatory Visit: Payer: PPO

## 2020-01-07 ENCOUNTER — Ambulatory Visit: Payer: PPO

## 2020-01-12 DIAGNOSIS — H524 Presbyopia: Secondary | ICD-10-CM | POA: Diagnosis not present

## 2020-03-20 ENCOUNTER — Other Ambulatory Visit: Payer: Self-pay | Admitting: Internal Medicine

## 2020-03-20 DIAGNOSIS — E2839 Other primary ovarian failure: Secondary | ICD-10-CM

## 2020-03-21 ENCOUNTER — Other Ambulatory Visit: Payer: Self-pay | Admitting: Internal Medicine

## 2020-03-21 DIAGNOSIS — Z1231 Encounter for screening mammogram for malignant neoplasm of breast: Secondary | ICD-10-CM

## 2020-03-22 ENCOUNTER — Ambulatory Visit: Payer: PPO | Admitting: Physician Assistant

## 2020-03-23 NOTE — Progress Notes (Signed)
MEDICARE ANNUAL WELLNESS VISIT AND FOLLOW UP   Assessment:   Encounter for Medicare annual wellness exam 1 year  Vitamin D deficiency Continue supplement  B12 deficiency Continue supplement  Irritable bowel syndrome, unspecified type If not on benefiber then add it, decrease stress,  if any worsening symptoms, blood in stool, AB pain, etc call office  Elevated BP without diagnosis of hypertension - well controlled/at goal off of medications, DASH diet, exercise and monitor at home. Call if greater than 130/80.   Estrogen deficiency Continue bASA and patch via GYN   Over 30 minutes of exam, counseling, chart review and critical decision making was performed Future Appointments  Date Time Provider [place]  09/05/2020 11:00 AM GI-[facility] MM 3 [facility]-BCGMM [facility]  09/05/2020 11:30 AM [facility] DX DEXA 1 GI-BCGDG [facility]  09/07/2020 11:00 AM Unk Pinto, MD [facility] None     Plan:   During the course of the visit the patient was educated and counseled about appropriate screening and preventive services including:    Pneumococcal vaccine   Prevnar 13  Influenza vaccine  Td vaccine  Screening electrocardiogram  Bone densitometry screening  Colorectal cancer screening  Diabetes screening  Glaucoma screening  Nutrition counseling   Advanced directives: requested   Subjective:  Kimberly Murphy is a 69 y.o. female who presents for Medicare Annual Wellness Visit and 3 month follow up.   Father passed year from [place], dad was 95 and at [place]. She reports has managed fairly since, feels at peace.   On estradiol patch and progesterone via GYN, Dr. Clovia Cuff.   BMI is Body mass index is 21.31 kg/m., she has been working on diet and exercise, has stationary bike, doing 2-3 days per week 10 min each, golfs several time a week, some walking in between weather permitting.  Wt Readings from Last 3 Encounters:  03/27/20 132 lb (59.9 kg)   09/07/19 131 lb (59.4 kg)  05/03/19 129 lb (58.5 kg)   Today their BP is BP: 122/72  She does workout. She denies chest pain, shortness of breath, dizziness.  She is not on cholesterol medication and denies myalgias. Her cholesterol is at goal. The cholesterol last visit was:   Lab Results  Component Value Date   CHOL 173 09/07/2019   HDL 91 09/07/2019   LDLCALC 64 09/07/2019   TRIG 93 09/07/2019   CHOLHDL 1.9 09/07/2019   Last A1C in the office was:  Lab Results  Component Value Date   HGBA1C 4.9 09/07/2019   Last GFR: Lab Results  Component Value Date   GFRNONAA 92 09/07/2019   Patient is on Vitamin D supplement.   Lab Results  Component Value Date   VD25OH 67 09/07/2019     She is on B12 sublingual for hx of deficiency:  Lab Results  Component Value Date   [id number] 09/03/2018     Medication Review:  Current Outpatient Medications (Endocrine & Metabolic):  .  estradiol (VIVELLE-DOT) 0.0375 MG/24HR, Place 1 patch onto the skin 2 (two) times a week. .  progesterone (PROMETRIUM) 200 MG capsule, Take 1 capsule (200 mg total) by mouth daily. Takes for 12 days at the beginning of Every Other Month    Current Outpatient Medications (Analgesics):  .  aspirin 81 MG tablet, Take 81 mg by mouth daily.  Current Outpatient Medications (Hematological):  Marland Kitchen  Cyanocobalamin (B-12) 1000 MCG SUBL, Place 1,000 mcg under the tongue daily.   Current Outpatient Medications (Other):  .  acyclovir ointment (ZOVIRAX) 5 %, Apply 1 application topically every 3 (three) hours. (Patient taking differently: Apply 1 application topically as needed. ) .  BIOTIN PO, Take 1 capsule by mouth daily. Marland Kitchen  CALCIUM CITRATE PO, Take 1 tablet by mouth daily.  .  Cholecalciferol (VITAMIN D-3) 5000 UNITS TABS, Take 2,500 Units by mouth daily. .  hyoscyamine (LEVSIN) 0.125 MG tablet, Take 1 tablet (0.125 mg total) by mouth every 4 (four) hours as needed for cramping (diarrhea, nausea). .   Magnesium 250 MG TABS, Take 250 mg by mouth daily.  .  Sulfacetamide Sodium, Acne, 10 % LOTN, Apply 1 to 2 times daily. (Patient taking differently: as needed. Apply 1 to 2 times daily)  Allergies Allergies  Allergen Reactions  . Pseudoephedrine-Dm-Gg     Increased Heart rate  . Seldane [Terfenadine]     Rapid palpitations  . Shellfish Allergy     N/V, diarrhea    Current Problems (verified) Patient Active Problem List   Diagnosis Date Noted  . Elevated BP without diagnosis of hypertension 03/11/2019  . Estrogen deficiency 03/11/2019  . Body mass index (BMI) of 20.0-20.9 in adult 09/23/2015  . IBS (irritable bowel syndrome)   . Vitamin D deficiency   . B12 deficiency     Screening Tests Immunization History  Administered Date(s) Administered  . DT (Pediatric) 09/28/2014  . Influenza Inj Mdck Quad With Preservative 08/14/2017  . Influenza Split 09/28/2013, 09/28/2014  . Influenza, High Dose Seasonal PF 09/03/2018, 08/23/2019  . Influenza, Seasonal, Injecte, Preservative Fre 09/22/2015  . Influenza,inj,quad, With Preservative 09/27/2016  . PFIZER SARS-COV-2 Vaccination 12/04/2019, 12/25/2019  . PPD Test 09/28/2013, 08/14/2017  . Pneumococcal Conjugate-13 09/28/2014  . Pneumococcal Polysaccharide-23 09/03/2018  . Pneumococcal-Unspecified 11/11/1998  . Td 11/12/2003  . Zoster 11/11/2005   Preventative care: Last colonoscopy: 2009 Dr. Henrene Pastor Cologuard negative 08/2018 Last mammogram: 08/2019 Last pap smear/pelvic exam: 04/2019 normal, Dr. Hulan Fray, abnormal in 2019,  DEXA: 08/2018 -1.2 osteopenia, scheduled follow up in Oct 2021  Prior vaccinations: TD or Tdap: 2015  Influenza: 08/2019  Pneumococcal: 2015 Prevnar13: 2019 Shingles/Zostavax: 2007, had significant IgG titer in 08/2018, declines  Covid 19: 2/2, 2021, pfizer  Names of Other Physician/Practitioners you currently use: 1. [facility] here for primary care 2. Last eye exam  2020, Dr. Prudencio Burly and [facility] Pain, has upcoming scheduled in June 2021 3. Last dental exam, 2021, Dr. Greggory Stallion  Patient Care Team: Unk Pinto, MD as PCP - General (Internal Medicine)  SURGICAL HISTORY She  has a past surgical history that includes Dilation and curettage of uterus ([place]). FAMILY HISTORY Her family history includes Arthritis in her father; Breast cancer in her mother; Cancer in her mother; Hypertension in her father; Macular degeneration in her father; Ulcerative colitis in her mother. SOCIAL HISTORY She  reports that she has never smoked. She has never used smokeless tobacco. She reports current alcohol use of about 14.0 standard drinks of alcohol per week. She reports previous drug use.   MEDICARE WELLNESS OBJECTIVES: Physical activity:   Cardiac risk factors:   Depression/mood screen:   Depression screen [facility] 2/9 03/27/2020  Decreased Interest 0  Down, Depressed, Hopeless 0  PHQ - 2 Score 0    ADLs:  In your present state of health, do you have any difficulty performing the following activities: 03/27/2020 09/11/2019  Hearing? N N  Vision? N N  Difficulty concentrating or making decisions? N N  Walking or climbing stairs? N N  Dressing or bathing? N N  Doing errands, shopping? N N  Some recent data might be hidden     Cognitive Testing  Alert? Yes  Normal Appearance?Yes  Oriented to person? Yes  Place? Yes   Time? Yes  Recall of three objects?  Yes  Can perform simple calculations? Yes  Displays appropriate judgment?Yes  Can read the correct time from a watch face?Yes  EOL planning: Does Patient Have a Medical Advance Directive?: Yes Type of Advance Directive: Healthcare Power of Attorney, Living will Does patient want to make changes to medical advance directive?: No - Patient declined Copy of [place] in Chart?: No - copy requested  Review of Systems  Constitutional: Negative.  Negative for malaise/fatigue and weight loss.   HENT: Negative.  Negative for hearing loss and tinnitus.   Eyes: Negative.  Negative for blurred vision and double vision.  Respiratory: Negative.  Negative for cough, sputum production, shortness of breath and wheezing.   Cardiovascular: Negative.  Negative for chest pain, palpitations, orthopnea, claudication, leg swelling and PND.  Gastrointestinal: Negative.  Negative for abdominal pain, blood in stool, constipation, diarrhea, heartburn, melena, nausea and vomiting.  Genitourinary: Negative.   Musculoskeletal: Negative.  Negative for falls, joint pain and myalgias.  Skin: Negative.  Negative for rash.  Neurological: Negative.  Negative for dizziness, tingling, sensory change, weakness and headaches.  Endo/Heme/Allergies: Negative.  Negative for polydipsia.  Psychiatric/Behavioral: Negative.  Negative for depression, memory loss, substance abuse and suicidal ideas. The patient is not nervous/anxious and does not have insomnia.   All other systems reviewed and are negative.    Objective:     Today's Vitals   03/27/20 1023  BP: 122/72  Pulse: 65  Temp: (!) 97 F (36.1 C)  SpO2: 99%  Weight: 132 lb (59.9 kg)  Height: 5\' 6"  (1.676 m)   Body mass index is 21.31 kg/m.  Eyes: PERRLA, EOMs, conjunctiva no swelling or erythema, normal fundi and vessels. Sinuses: No frontal/maxillary tenderness ENT/Mouth: EACs patent / TMs  nl. Nares clear without erythema, swelling, mucoid exudates. Oral hygiene is good. No erythema, swelling, or exudate. Tongue normal, non-obstructing. Tonsils not swollen or erythematous. Hearing normal.  Neck: Supple, thyroid not palpable. No bruits, nodes or JVD. Respiratory: Respiratory effort normal.  BS equal and clear bilateral without rales, rhonci, wheezing or stridor. Cardio: Heart sounds are normal with regular rate and rhythm and no murmurs, rubs or gallops. Peripheral pulses are normal and equal bilaterally without edema. No aortic or femoral  bruits. Chest: symmetric with normal excursions and percussion. Breasts: Symmetric, without lumps, nipple discharge, retractions, or fibrocystic changes.  Abdomen: Flat, soft with bowel sounds active. Nontender, no guarding, rebound, hernias, masses, or organomegaly.  Lymphatics: Non tender without lymphadenopathy.  Genitourinary:  Musculoskeletal: Full ROM all peripheral extremities, joint stability, 5/5 strength, and normal gait. Skin: Warm and dry without rashes, lesions, cyanosis, clubbing or  ecchymosis.  Neuro: Cranial nerves intact, reflexes equal bilaterally. Normal muscle tone, no cerebellar symptoms. Sensation intact.  Pysch: Alert and oriented X 3, normal affect, Insight and Judgment appropriate.    Medicare Attestation I have personally reviewed: The patient's medical and social history Their use of alcohol, tobacco or illicit drugs Their current medications and supplements The patient's functional ability including ADLs,fall risks, home safety risks, cognitive, and hearing and visual impairment Diet and physical activities Evidence for depression or mood disorders  The patient's weight, height, BMI, and visual acuity have been recorded in the chart.  I have made referrals, counseling, and provided education to the patient based on review of the above and I have provided the patient with a written personalized care plan for preventive services.     Izora Ribas, NP   03/27/2020

## 2020-03-27 ENCOUNTER — Ambulatory Visit (INDEPENDENT_AMBULATORY_CARE_PROVIDER_SITE_OTHER): Payer: PPO | Admitting: Adult Health

## 2020-03-27 ENCOUNTER — Ambulatory Visit: Payer: PPO | Admitting: Physician Assistant

## 2020-03-27 ENCOUNTER — Other Ambulatory Visit: Payer: Self-pay

## 2020-03-27 ENCOUNTER — Encounter: Payer: Self-pay | Admitting: Adult Health

## 2020-03-27 VITALS — BP 122/72 | HR 65 | Temp 97.0°F | Ht 66.0 in | Wt 132.0 lb

## 2020-03-27 DIAGNOSIS — Z6820 Body mass index (BMI) 20.0-20.9, adult: Secondary | ICD-10-CM

## 2020-03-27 DIAGNOSIS — R030 Elevated blood-pressure reading, without diagnosis of hypertension: Secondary | ICD-10-CM | POA: Diagnosis not present

## 2020-03-27 DIAGNOSIS — E2839 Other primary ovarian failure: Secondary | ICD-10-CM | POA: Diagnosis not present

## 2020-03-27 DIAGNOSIS — Z0000 Encounter for general adult medical examination without abnormal findings: Secondary | ICD-10-CM | POA: Diagnosis not present

## 2020-03-27 DIAGNOSIS — E538 Deficiency of other specified B group vitamins: Secondary | ICD-10-CM | POA: Diagnosis not present

## 2020-03-27 DIAGNOSIS — E559 Vitamin D deficiency, unspecified: Secondary | ICD-10-CM | POA: Diagnosis not present

## 2020-03-27 DIAGNOSIS — K589 Irritable bowel syndrome without diarrhea: Secondary | ICD-10-CM | POA: Diagnosis not present

## 2020-03-27 NOTE — Patient Instructions (Signed)
  Kimberly Murphy , Thank you for taking time to come for your Medicare Wellness Visit. I appreciate your ongoing commitment to your health goals. Please review the following plan we discussed and let me know if I can assist you in the future.    This is a list of the screening recommended for you and due dates:  Health Maintenance  Topic Date Due  . Flu Shot  06/11/2020  . Mammogram  08/22/2020  . Cologuard (Stool DNA test)  08/14/2021  . Tetanus Vaccine  09/28/2024  . DEXA scan (bone density measurement)  Completed  . COVID-19 Vaccine  Completed  .  Hepatitis C: One time screening is recommended by Center for Disease Control  (CDC) for  adults born from [birth year] through 1963.   Completed  . Pneumonia vaccines  Completed     Know what a healthy weight is for you (roughly BMI <25) and aim to maintain this  Aim for 7+ servings of fruits and vegetables daily  65-80+ fluid ounces of water or unsweet tea for healthy kidneys  Limit to max 1 drink of alcohol per day; avoid smoking/tobacco  Limit animal fats in diet for cholesterol and heart health - choose grass fed whenever available  Avoid highly processed foods, and foods high in saturated/trans fats  Aim for low stress - take time to unwind and care for your mental health  Aim for 150 min of moderate intensity exercise weekly for heart health, and weights twice weekly for bone health  Aim for 7-9 hours of sleep daily    A great goal to work towards is aiming to get in a serving daily of some of the most nutritionally dense foods - G- BOMBS daily

## 2020-04-27 DIAGNOSIS — H04123 Dry eye syndrome of bilateral lacrimal glands: Secondary | ICD-10-CM | POA: Diagnosis not present

## 2020-04-27 DIAGNOSIS — H2513 Age-related nuclear cataract, bilateral: Secondary | ICD-10-CM | POA: Diagnosis not present

## 2020-04-27 DIAGNOSIS — H5203 Hypermetropia, bilateral: Secondary | ICD-10-CM | POA: Diagnosis not present

## 2020-05-02 DIAGNOSIS — D2272 Melanocytic nevi of left lower limb, including hip: Secondary | ICD-10-CM | POA: Diagnosis not present

## 2020-05-02 DIAGNOSIS — L821 Other seborrheic keratosis: Secondary | ICD-10-CM | POA: Diagnosis not present

## 2020-05-02 DIAGNOSIS — L570 Actinic keratosis: Secondary | ICD-10-CM | POA: Diagnosis not present

## 2020-05-02 DIAGNOSIS — D2271 Melanocytic nevi of right lower limb, including hip: Secondary | ICD-10-CM | POA: Diagnosis not present

## 2020-05-02 DIAGNOSIS — D2261 Melanocytic nevi of right upper limb, including shoulder: Secondary | ICD-10-CM | POA: Diagnosis not present

## 2020-05-02 DIAGNOSIS — D1801 Hemangioma of skin and subcutaneous tissue: Secondary | ICD-10-CM | POA: Diagnosis not present

## 2020-05-02 DIAGNOSIS — D2262 Melanocytic nevi of left upper limb, including shoulder: Secondary | ICD-10-CM | POA: Diagnosis not present

## 2020-05-02 DIAGNOSIS — L814 Other melanin hyperpigmentation: Secondary | ICD-10-CM | POA: Diagnosis not present

## 2020-05-09 ENCOUNTER — Other Ambulatory Visit: Payer: Self-pay

## 2020-05-09 ENCOUNTER — Encounter: Payer: Self-pay | Admitting: Family Medicine

## 2020-05-09 ENCOUNTER — Ambulatory Visit (INDEPENDENT_AMBULATORY_CARE_PROVIDER_SITE_OTHER): Payer: PPO | Admitting: Family Medicine

## 2020-05-09 ENCOUNTER — Other Ambulatory Visit (HOSPITAL_COMMUNITY)
Admission: RE | Admit: 2020-05-09 | Discharge: 2020-05-09 | Disposition: A | Discharge status: Home / Self Care | Payer: PPO | Source: Ambulatory Visit | Attending: Family Medicine | Admitting: Family Medicine

## 2020-05-09 DIAGNOSIS — Z01419 Encounter for gynecological examination (general) (routine) without abnormal findings: Secondary | ICD-10-CM

## 2020-05-09 DIAGNOSIS — Z780 Asymptomatic menopausal state: Secondary | ICD-10-CM | POA: Diagnosis not present

## 2020-05-09 DIAGNOSIS — Z124 Encounter for screening for malignant neoplasm of cervix: Secondary | ICD-10-CM

## 2020-05-09 DIAGNOSIS — Z1151 Encounter for screening for human papillomavirus (HPV): Secondary | ICD-10-CM | POA: Diagnosis not present

## 2020-05-09 MED ORDER — ESTRADIOL 0.0375 MG/24HR TD PTTW: 1.0000 | MEDICATED_PATCH | TRANSDERMAL | 3 refills | Status: DC

## 2020-05-09 MED ORDER — PROGESTERONE 200 MG PO CAPS
200.0000 mg | ORAL_CAPSULE | Freq: Every day | ORAL | 3 refills | Status: DC
Start: 2020-05-09 — End: 2020-07-20

## 2020-05-09 NOTE — Progress Notes (Signed)
  Subjective:     Kimberly Murphy is a 69 y.o. female and is here for a comprehensive physical exam. The patient reports no problems. 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 Desires repeat pap.   The following portions of the patient's history were reviewed and updated as appropriate: allergies, current medications, past family history, past medical history, past social history, past surgical history and problem list.  Review of Systems Pertinent items noted in HPI and remainder of comprehensive ROS otherwise negative.   Objective:    BP 134/82   Wt 134 lb 8 oz (61 kg)   BMI 21.71 kg/m  General appearance: alert, cooperative and appears stated age Head: Normocephalic, without obvious abnormality, atraumatic Neck: no adenopathy, supple, symmetrical, trachea midline and thyroid not enlarged, symmetric, no tenderness/mass/nodules Lungs: clear to auscultation bilaterally Breasts: normal appearance, no masses or tenderness Heart: regular rate and rhythm, S1, S2 normal, no murmur, click, rub or gallop and regular rate and rhythm Abdomen: soft, non-tender; bowel sounds normal; no masses,  no organomegaly Pelvic: cervix normal in appearance, external genitalia normal, no adnexal masses or tenderness, no cervical motion tenderness, uterus normal size, shape, and consistency and vaginal atrophy Extremities: Homans sign is negative, no sign of DVT Pulses: 2+ and symmetric Skin: Skin color, texture, turgor normal. No rashes or lesions Lymph nodes: Cervical, supraclavicular, and axillary nodes normal. Neurologic: Grossly normal    Assessment:    Healthy GYN female exam.      Plan:  Screening for malignant neoplasm of cervix - ASCUS pap with Neg HPV 2019, nml pap with neg HPV 2020, desires repeat pap - Plan: Cytology - PAP( [facility])  Encounter for gynecological examination without abnormal finding - Plan: Cytology - PAP( [facility])     Return  in 1 year (on 05/09/2021).  See After Visit Summary for Counseling Recommendations

## 2020-05-09 NOTE — Progress Notes (Signed)
Mammogram 08/23/2019- normal  Pap 05/03/2019- normal

## 2020-05-12 LAB — CYTOLOGY - PAP
Comment: NEGATIVE
Diagnosis: NEGATIVE
High risk HPV: NEGATIVE

## 2020-07-20 ENCOUNTER — Ambulatory Visit
Admission: RE | Admit: 2020-07-20 | Discharge: 2020-07-20 | Disposition: A | Discharge status: Home / Self Care | Payer: PPO | Source: Ambulatory Visit | Attending: Internal Medicine | Admitting: Internal Medicine

## 2020-07-20 ENCOUNTER — Ambulatory Visit (INDEPENDENT_AMBULATORY_CARE_PROVIDER_SITE_OTHER): Payer: PPO | Admitting: Internal Medicine

## 2020-07-20 ENCOUNTER — Other Ambulatory Visit: Payer: Self-pay

## 2020-07-20 ENCOUNTER — Encounter: Payer: Self-pay | Admitting: Internal Medicine

## 2020-07-20 VITALS — BP 118/80 | HR 78 | Temp 97.9°F | Ht 66.0 in | Wt 133.0 lb

## 2020-07-20 DIAGNOSIS — M25552 Pain in left hip: Secondary | ICD-10-CM | POA: Diagnosis not present

## 2020-07-20 DIAGNOSIS — M79605 Pain in left leg: Secondary | ICD-10-CM | POA: Diagnosis not present

## 2020-07-20 DIAGNOSIS — M2550 Pain in unspecified joint: Secondary | ICD-10-CM | POA: Diagnosis not present

## 2020-07-20 DIAGNOSIS — M545 Low back pain, unspecified: Secondary | ICD-10-CM

## 2020-07-20 DIAGNOSIS — M47816 Spondylosis without myelopathy or radiculopathy, lumbar region: Secondary | ICD-10-CM | POA: Diagnosis not present

## 2020-07-20 DIAGNOSIS — Z79899 Other long term (current) drug therapy: Secondary | ICD-10-CM

## 2020-07-20 DIAGNOSIS — M4186 Other forms of scoliosis, lumbar region: Secondary | ICD-10-CM | POA: Diagnosis not present

## 2020-07-20 DIAGNOSIS — M47817 Spondylosis without myelopathy or radiculopathy, lumbosacral region: Secondary | ICD-10-CM | POA: Diagnosis not present

## 2020-07-20 MED ORDER — MELOXICAM 15 MG PO TABS: ORAL_TABLET | ORAL | 0 refills | Status: DC

## 2020-07-20 MED ORDER — PROGESTERONE 200 MG PO CAPS: ORAL_CAPSULE | ORAL | 3 refills | Status: DC

## 2020-07-20 MED ORDER — VITAMIN D-3 125 MCG (5000 UT) PO TABS: ORAL_TABLET | ORAL | Status: AC

## 2020-07-20 NOTE — Progress Notes (Signed)
================================== ° °-   Pelvis & Left Hip joint appear Normal ==================================

## 2020-07-20 NOTE — Progress Notes (Signed)
History of Present Illness:     This very nice 70 yo MWF  Presents with a 2 - 3 month hx/o generalized joint aches in "all" joints and especially in the left low back and left hip area. She also reports occasional "shooting" pains down her left leg. In addition, she has had occasional feeling of instability of the right knee - a buckling sensation "side-to - side".     Medications  Current Outpatient Medications (Endocrine & Metabolic):  .  estradiol (VIVELLE-DOT) 0.0375 MG/24HR, Place 1 patch onto the skin 2 (two) times a week. .  progesterone (PROMETRIUM) 200 MG capsule, Take 1 capsule (200 mg total) by mouth daily.    Current Outpatient Medications (Analgesics):  .  aspirin 81 MG tablet, Take 81 mg by mouth daily.  Current Outpatient Medications (Hematological):  Marland Kitchen  Cyanocobalamin (B-12) 1000 MCG SUBL, Place 1,000 mcg under the tongue daily.   Current Outpatient Medications (Other):  .  acyclovir ointment (ZOVIRAX) 5 %, Apply 1 application topically every 3 (three) hours. (Patient taking differently: Apply 1 application topically as needed. ) .  BIOTIN PO, Take 1 capsule by mouth daily. Marland Kitchen  CALCIUM CITRATE PO, Take 1 tablet by mouth daily.  .  Cholecalciferol (VITAMIN D-3) 5000 UNITS TABS, Take 2,500 Units by mouth daily. .  hyoscyamine (LEVSIN) 0.125 MG tablet, Take 1 tablet (0.125 mg total) by mouth every 4 (four) hours as needed for cramping (diarrhea, nausea). .  Magnesium 250 MG TABS, Take 250 mg by mouth daily.  .  Sulfacetamide Sodium, Acne, 10 % LOTN, Apply 1 to 2 times daily.  Problem list She has IBS (irritable bowel syndrome); Vitamin D deficiency; B12 deficiency; Body mass index (BMI) of 20.0-20.9 in adult; Elevated BP without diagnosis of hypertension; and Estrogen deficiency on their problem list.   Observations/Objective:   BP 118/80   Pulse 78   Temp 97.9 F (36.6 C)   Ht 5\' 6"  (1.676 m)   Wt 133 lb (60.3 kg)   BMI 21.47 kg/m   HEENT - WNL. Neck -  supple.  Chest - Clear equal BS. Cor - Nl HS. RRR w/o sig MGR. PP 1(+). No edema. MS- FROM w/o deformities.  Gait Nl. Heberden's & Bouchard's nodes Rt>Lt SLR Neg bilat. Hip ROM & fig-4 Nl bilat. Sl tender of left ischial > greater tuberosity bursa.  Neuro -  Nl w/o focal abnormalities.  Assessment and Plan:   1. Low back pain radiating to left leg  - Sedimentation rate - Cyclic citrul peptide antibody, IgG - C-reactive protein - DG Lumbar Spine Complete; Future - meloxicam (MOBIC) 15 MG tablet; Take 1/2 to 1 tablet Daily  Disp: 90 tablet  2. Left hip pain  - Sedimentation rate - Cyclic citrul peptide antibody, IgG - C-reactive protein - DG Hip Unilat W OR W/O Pelvis 2-3 Views Left; Future - meloxicam  15 MG tablet; Take 1/2 to 1 tablet Daily Disp: 90 tab  3. Arthralgis, generalized  - Sedimentation rate - Cyclic citrul peptide antibody, IgG - C-reactive protein - meloxicam  15 MG tablet; Take     1/2 to 1 tablet     Daily  Disp: 90 tab  4. Medication management  - CBC with Differential/Platelet   Follow Up Instructions:        I discussed the assessment and treatment plan with the patient.  Patient deferred trial on steroid pulse/taper pending X-rays & labs. The patient was provided an opportunity to ask questions  and all were answered. The patient agreed with the plan and demonstrated an understanding of the instructions.       The patient was advised to call back or seek an in-person evaluation if the symptoms worsen or if the condition fails to improve as anticipated.   Kirtland Bouchard, MD

## 2020-07-20 NOTE — Progress Notes (Signed)
=======================================  -   No Significant Arthritic abnormalities of the   Lumbosacral spine  =======================================

## 2020-07-21 LAB — C-REACTIVE PROTEIN: CRP: 1.3 mg/L (ref <8.0)

## 2020-07-21 LAB — CBC WITH DIFFERENTIAL/PLATELET
Absolute Monocytes: 460 cells/uL (ref 200–950)
Basophils Absolute: 53 cells/uL (ref 0–200)
Basophils Relative: 0.9 %
Eosinophils Absolute: 83 cells/uL (ref 15–500)
Eosinophils Relative: 1.4 %
HCT: 38.6 % (ref 35.0–45.0)
Hemoglobin: 12.9 g/dL (ref 11.7–15.5)
Lymphs Abs: 1546 cells/uL (ref 850–3900)
MCH: 29.9 pg (ref 27.0–33.0)
MCHC: 33.4 g/dL (ref 32.0–36.0)
MCV: 89.6 fL (ref 80.0–100.0)
MPV: 9.6 fL (ref 7.5–12.5)
Monocytes Relative: 7.8 %
Neutro Abs: 3758 cells/uL (ref 1500–7800)
Neutrophils Relative %: 63.7 %
Platelets: 283 10*3/uL (ref 140–400)
RBC: 4.31 10*6/uL (ref 3.80–5.10)
RDW: 12.5 % (ref 11.0–15.0)
Total Lymphocyte: 26.2 %
WBC: 5.9 10*3/uL (ref 3.8–10.8)

## 2020-07-21 LAB — SEDIMENTATION RATE: Sed Rate: 2 mm/h (ref 0–30)

## 2020-07-21 LAB — CYCLIC CITRUL PEPTIDE ANTIBODY, IGG: Cyclic Citrullin Peptide Ab: <16 UNITS

## 2020-07-21 NOTE — Progress Notes (Signed)
=============================================================== -   Test results slightly outside the reference range are not unusual. If there is anything important, I will review this with you,  otherwise it is considered normal test values.  If you have further questions,  please do not hesitate to contact me at the office or via My Chart.  ===============================================================  -  CBC - Normal - No sign of anemia, infection or inflammation  ===============================================================  -  Sed Rate (ESR) and CRP tests for Inflammation are both normal and OK  &  do Not suggest an inflammatory or Autoimmune  Arthritic process.  ===============================================================  -  Anti- CCP  (Cyclic Citrullin Peptide  Antibody  - the Most sensitive test to  diagnose Rheumatoid Arthritis)  is Normal & OK also ==========================================================  -  So, let's see how you do with the Tylenol 1,000 mg 4 x /day and   Meloxicam 15 mg Daily   - and if not significantly better over the next 2 weeks, then the next step is  for a trial on steroids (prednisone)   - before we can order a MRI of the back.   - So please keep me posted on your progress.   ==========================================================

## 2020-07-29 ENCOUNTER — Other Ambulatory Visit: Payer: Self-pay | Admitting: Internal Medicine

## 2020-07-29 MED ORDER — ESOMEPRAZOLE MAGNESIUM 40 MG PO CPDR: DELAYED_RELEASE_CAPSULE | ORAL | 1 refills | Status: DC

## 2020-07-29 NOTE — Progress Notes (Signed)
  Kimberly Murphy,  I sent in Rx for Nexium to [facility] to "protect" your stomach while taking the Meloxicam (I have to do the same thing)

## 2020-09-05 ENCOUNTER — Ambulatory Visit
Admission: RE | Admit: 2020-09-05 | Discharge: 2020-09-05 | Disposition: A | Discharge status: Home / Self Care | Payer: PPO | Source: Ambulatory Visit | Attending: Internal Medicine | Admitting: Internal Medicine

## 2020-09-05 ENCOUNTER — Other Ambulatory Visit: Payer: Self-pay

## 2020-09-05 DIAGNOSIS — M8589 Other specified disorders of bone density and structure, multiple sites: Secondary | ICD-10-CM | POA: Diagnosis not present

## 2020-09-05 DIAGNOSIS — Z1231 Encounter for screening mammogram for malignant neoplasm of breast: Secondary | ICD-10-CM

## 2020-09-05 DIAGNOSIS — E2839 Other primary ovarian failure: Secondary | ICD-10-CM

## 2020-09-05 DIAGNOSIS — Z780 Asymptomatic menopausal state: Secondary | ICD-10-CM | POA: Diagnosis not present

## 2020-09-05 NOTE — Progress Notes (Signed)
========================================================== ==========================================================  -    Bone Density test shows Osteopenia  - So recommend - calcium 500 - 600 mg /day                              - Vitamin D and                               -  Exercise  ========================================================== ==========================================================

## 2020-09-06 ENCOUNTER — Encounter: Payer: Self-pay | Admitting: Internal Medicine

## 2020-09-06 NOTE — Progress Notes (Signed)
Annual Screening/Preventative Visit & Comprehensive Evaluation &  Examination      This very nice 69 y.o.  MWF presents for a Screening /Preventative Visit & comprehensive evaluation and management of multiple medical co-morbidities.  Patient has been followed for BP screening , HLD, Prediabetes  and Vitamin D Deficiency.   [[ COPIED from Oct 2019 - Patient has a very remote hx/o Galactorrhea and elevated Prolactin levels and a ? of a Pituitary Microadenoma and in 2010 Dr Ellene Route felt that her previous MRI's had been over interpreted and that she did not have a Pituitary Adenoma.[facility] annual Visual Field screening by Dr Claudean Kinds for years]]       Patient has hx/o of 1 borderline elevated BP in 2016.  Patient's BP has been controlled  and patient denies any cardiac symptoms as chest pain, palpitations, shortness of breath, dizziness or ankle swelling. Today's BP is at goal -  118/80.       Patient's hyperlipidemia is controlled with diet. Last lipids were at goal: Lab Results  Component Value Date   CHOL 173 09/07/2019   HDL 91 09/07/2019   LDLCALC 64 09/07/2019   TRIG 93 09/07/2019   CHOLHDL 1.9 09/07/2019       Patient has been monitored expectantly for glucose intolerance and patient denies reactive hypoglycemic symptoms, visual blurring, diabetic polys or paresthesias. Last A1c was Normal & at goal:  Lab Results  Component Value Date   HGBA1C 4.9 09/07/2019       Finally, patient has history of Vitamin D Deficiency and last Vitamin D was at goal:  Lab Results  Component Value Date   VD25OH 67 09/07/2019    Current Outpatient Medications on File Prior to Visit  Medication Sig  . aspirin 81 MG tablet Take 81 mg by mouth daily.  Marland Kitchen BIOTIN PO Take 1 capsule by mouth daily.  Marland Kitchen CALCIUM  Take 1 tablet by mouth daily.   Marland Kitchen VITAMIN D 5000 U Takes 5,000 units Daily  . Vitamin B-12 1000 MCG SL Place under the tongue daily.   Marland Kitchen esomeprazole  40 MG  Take 1 capsule Daily    . VIVELLE-DOT 0.0375 MG/24HR Place 1 patch  2 times a week.  . hyoscyamine  0.125 MG tablet Take 1 tablet every 4  hrs as needed for cramping  . Magnesium 250 MG TABS Take 250 mg by mouth daily.   . meloxicam  15 MG tablet Take     1/2 to 1 tablet     Daily        . progesterone  200 MG capsule Takes 1 capsule for 12 days alternate months    Allergies  Allergen Reactions  . Pseudoephedrine-Dm-Gg     Increased Heart rate  . Seldane [Terfenadine]     Rapid palpitations  . Shellfish Allergy     N/V, diarrhea   Past Medical History:  Diagnosis Date  . Allergy   . Aphthous stomatitis   . B12 deficiency   . Galactorrhea    with elevated Prolactin levels  . Hemorrhoid   . HSV-1 (herpes simplex virus 1) infection   . IBS (irritable bowel syndrome)   . Pituitary microadenoma ([place])   . Synovitis of finger 2003   Left ring finger  . Tennis elbow 2007   Right elbow  . Vitamin D deficiency    Health Maintenance  Topic Date Due  . INFLUENZA VACCINE  06/11/2020  . Fecal DNA (Cologuard)  08/14/2021  . MAMMOGRAM  09/05/2021  . TETANUS/TDAP  09/28/2024  . DEXA SCAN  Completed  . COVID-19 Vaccine  Completed  . Hepatitis C Screening  Completed  . PNA vac Low Risk Adult  Completed   Immunization History  Administered Date(s) Administered  . DT (Pediatric) 09/28/2014  . Influenza Inj Mdck Quad With Preservative 08/14/2017  . Influenza Split 09/28/2013, 09/28/2014  . Influenza, High Dose Seasonal PF 09/03/2018, 08/23/2019  . Influenza, Seasonal, Injecte, Preservative Fre 09/22/2015  . Influenza,inj,quad, With Preservative 09/27/2016  . PFIZER SARS-COV-2 Vaccination 12/04/2019, 12/25/2019  . PPD Test 09/28/2013, 08/14/2017  . Pneumococcal Conjugate-13 09/28/2014  . Pneumococcal Polysaccharide-23 09/03/2018  . Pneumococcal-Unspecified 11/11/1998  . Td 11/12/2003  . Zoster 11/11/2005    Last Colon - 06/21/2008 - Dr Henrene Pastor  Cologard - 09/24/2018 - Negative  - recc 3 yr f/u due  Nov 2022  Last MGM - 09/06/2020  Past Surgical History:  Procedure Laterality Date  . DILATION AND CURETTAGE OF UTERUS  1994 & 1996   Family History  Problem Relation Age of Onset  . Cancer Mother        Breast  . Ulcerative colitis Mother   . Breast cancer Mother 52  . Arthritis Father   . Hypertension Father   . Macular degeneration Father   . Breast cancer Paternal Aunt    Social History   Tobacco Use  . Smoking status: Never Smoker  . Smokeless tobacco: Never Used  Vaping Use  . Vaping Use: Never used  Substance Use Topics  . Alcohol use: Yes    Alcohol/week: 14.0 standard drinks    Types: 14 Standard drinks or equivalent per week  . Drug use: Not Currently    ROS Constitutional: Denies fever, chills, weight loss/gain, headaches, insomnia,  night sweats, and change in appetite. Does c/o fatigue. Eyes: Denies redness, blurred vision, diplopia, discharge, itchy, watery eyes.  ENT: Denies discharge, congestion, post nasal drip, epistaxis, sore throat, earache, hearing loss, dental pain, Tinnitus, Vertigo, Sinus pain, snoring.  Cardio: Denies chest pain, palpitations, irregular heartbeat, syncope, dyspnea, diaphoresis, orthopnea, PND, claudication, edema Respiratory: denies cough, dyspnea, DOE, pleurisy, hoarseness, laryngitis, wheezing.  Gastrointestinal: Denies dysphagia, heartburn, reflux, water brash, pain, cramps, nausea, vomiting, bloating, diarrhea, constipation, hematemesis, melena, hematochezia, jaundice, hemorrhoids Genitourinary: Denies dysuria, frequency, urgency, nocturia, hesitancy, discharge, hematuria, flank pain Breast: Breast lumps, nipple discharge, bleeding.  Musculoskeletal: Denies arthralgia, myalgia, stiffness, Jt. Swelling, pain, limp, and strain/sprain. Denies falls. Skin: Denies puritis, rash, hives, warts, acne, eczema, changing in skin lesion Neuro: No weakness, tremor, incoordination, spasms, paresthesia, pain Psychiatric: Denies confusion,  memory loss, sensory loss. Denies Depression. Endocrine: Denies change in weight, skin, hair change, nocturia, and paresthesia, diabetic polys, visual blurring, hyper / hypo glycemic episodes.  Heme/Lymph: No excessive bleeding, bruising, enlarged lymph nodes.  Physical Exam  BP 118/80   Pulse 72   Temp (!) 97 F (36.1 C)   Resp 16   Ht 5\' 6"  (1.676 m)   Wt 133 lb (60.3 kg)   SpO2 99%   BMI 21.47 kg/m   General Appearance: Well nourished, well groomed and in no apparent distress.  Eyes: PERRLA, EOMs, conjunctiva no swelling or erythema, normal fundi and vessels. Sinuses: No frontal/maxillary tenderness ENT/Mouth: EACs patent / TMs  nl. Nares clear without erythema, swelling, mucoid exudates. Oral hygiene is good. No erythema, swelling, or exudate. Tongue normal, non-obstructing. Tonsils not swollen or erythematous. Hearing normal.  Neck: Supple, thyroid not palpable. No bruits, nodes or JVD. Respiratory: Respiratory effort normal.  BS equal and clear bilateral without rales, rhonci, wheezing or stridor. Cardio: Heart sounds are normal with regular rate and rhythm and no murmurs, rubs or gallops. Peripheral pulses are normal and equal bilaterally without edema. No aortic or femoral bruits. Chest: symmetric with normal excursions and percussion. Breasts: Symmetric, without lumps, nipple discharge, retractions, or fibrocystic changes.  Abdomen: Flat, soft with bowel sounds active. Nontender, no guarding, rebound, hernias, masses, or organomegaly.  Lymphatics: Non tender without lymphadenopathy.  Genitourinary:  Musculoskeletal: Full ROM all peripheral extremities, joint stability, 5/5 strength, and normal gait. Skin: Warm and dry without rashes, lesions, cyanosis, clubbing or  ecchymosis.  Neuro: Cranial nerves intact, reflexes equal bilaterally. Normal muscle tone, no cerebellar symptoms. Sensation intact.  Pysch: Alert and oriented X 3, normal affect, Insight and Judgment appropriate.    Assessment and Plan  1. Annual Preventative Screening Examination   2. Elevated BP without diagnosis of hypertension  - EKG 12-Lead - Urinalysis, Routine w reflex microscopic - Microalbumin / creatinine urine ratio - CBC with Differential/Platelet - COMPLETE METABOLIC PANEL WITH GFR - Magnesium - TSH  3. Lipid screening  - EKG 12-Lead - Lipid panel - TSH  4. Abnormal glucose  - EKG 12-Lead - Hemoglobin A1c - Insulin, random  5. Vitamin D deficiency  - VITAMIN D 25 Hydroxy   6. Screening for colorectal cancer   7. Screening for ischemic heart disease  - EKG 12-Lead  8. FH: hypertension  - EKG 12-Lead  9. Medication management  - Urinalysis, Routine w reflex microscopic - Microalbumin / creatinine urine ratio  10. Need for immunization against influenza  - Flu vaccine HIGH DOSE PF (Fluzone High dose)        Patient was counseled in prudent diet to achieve/maintain BMI less than 25 for weight control, BP monitoring, regular exercise and medications. Discussed med's effects and SE's. Screening labs and tests as requested with regular follow-up as recommended. Over 40 minutes of exam, counseling, chart review and high complex critical decision making was performed.   Kirtland Bouchard, MD

## 2020-09-06 NOTE — Patient Instructions (Addendum)
+++++++++++++++++++++++++ S T O P       B I O T  I  N ++++++++++++++++++++++++++ Due to recent changes in healthcare laws, you may see the results of your imaging and laboratory studies on MyChart before your provider has had a chance to review them.  We understand that in some cases there may be results that are confusing or concerning to you. Not all laboratory results come back in the same time frame and the provider may be waiting for multiple results in order to interpret others.  Please give [place] 48 hours in order for your provider to thoroughly review all the results before contacting the office for clarification of your results.   +++++++++++++++++++++++++  Vit D  & Vit C 1,000 mg   are recommended to help protect  against the Covid-19 and other Corona viruses.    Also it's recommended  to take  Zinc 50 mg  to help  protect against the Covid-19   and best place to get  is also on [email or web address]  and don't pay more than 6-8 cents /pill !  ================================ Coronavirus (COVID-19) Are you at risk?  Are you at risk for the Coronavirus (COVID-19)?  To be considered HIGH RISK for Coronavirus (COVID-19), you have to meet the following criteria:  . Traveled to [place], [place], [place], [place] or [place]; or in the [place] to [place], [place], [place]  . or [place]; and have fever, cough, and shortness of breath within the last 2 weeks of travel OR . Been in close contact with a person diagnosed with COVID-19 within the last 2 weeks and have  . fever, cough,and shortness of breath .  . IF YOU DO NOT MEET THESE CRITERIA, YOU ARE CONSIDERED LOW RISK FOR COVID-19.  What to do if you are HIGH RISK for COVID-19?  Marland Kitchen If you are having a medical emergency, call 911. . Seek medical care right away. Before you go to a doctor's office, urgent care or emergency department, .  call ahead and tell them about your recent travel, contact with someone diagnosed with  COVID-19  .  and your symptoms.  . You should receive instructions from your physician's office regarding next steps of care.  . When you arrive at healthcare provider, tell the healthcare staff immediately you have returned from  . visiting [place], [place], [place], [place] or [place]; or traveled in the [place] to [place], [place],  . [place] or [place] in the last two weeks or you have been in close contact with a person diagnosed with  . COVID-19 in the last 2 weeks.   . Tell the health care staff about your symptoms: fever, cough and shortness of breath. . After you have been seen by a medical provider, you will be either: o Tested for (COVID-19) and discharged home on quarantine except to seek medical care if  o symptoms worsen, and asked to  - Stay home and avoid contact with others until you get your results (4-5 days)  - Avoid travel on public transportation if possible (such as bus, train, or airplane) or o Sent to the Emergency Department by EMS for evaluation, COVID-19 testing  and  o possible admission depending on your condition and test results.  What to do if you are LOW RISK for COVID-19?  Reduce your risk of any infection by using the same precautions used for avoiding the common cold or flu:  Marland Kitchen Wash your  with soap and warm water for at least 20 seconds.  If soap and water are not readily available,  . use an alcohol-based hand sanitizer with at least 60% alcohol.  . If coughing or sneezing, cover your mouth and nose by coughing or sneezing into the elbow areas of your shirt or coat, .  into a tissue or into your sleeve (not your hands). . Avoid shaking hands with others and consider head nods or verbal greetings only. . Avoid touching your eyes, nose, or mouth with unwashed hands.  . Avoid close contact with people who are sick. . Avoid places or events with large numbers of people in one location, like concerts or sporting  events. . Carefully consider travel plans you have or are making. . If you are planning any travel outside or inside the [place], visit the CDC's Travelers' Health webpage for the latest health notices. . If you have some symptoms but not all symptoms, continue to monitor at home and seek medical attention  . if your symptoms worsen. . If you are having a medical emergency, call 911.   . >>>>>>>>>>>>>>>>>>>>>>>>>>>>>>>>> . We Do NOT Approve of  [facility] [place] Soliciting Our Patients  To Do Home Visits & We Do NOT Approve of [employer] > > > > > > > > > > > > > > > > > > > > > > > > > > > > > > > > > > > > > > >  Preventive Care for Adults  A healthy lifestyle and preventive care can promote health and wellness. Preventive health guidelines for women include the following key practices.  A routine yearly physical is a good way to check with your health care provider about your health and preventive screening. It is a chance to share any concerns and updates on your health and to receive a thorough exam.  Visit your dentist for a routine exam and preventive care every 6 months. Brush your teeth twice a day and floss once a day. Good oral hygiene prevents tooth decay and gum disease.  The frequency of eye exams is based on your age, health, family medical history, use of contact lenses, and other factors. Follow your health care provider's recommendations for frequency of eye exams.  Eat a healthy diet. Foods like vegetables, fruits, whole grains, low-fat dairy products, and lean protein foods contain the nutrients you need without too many calories. Decrease your intake of foods high in solid fats, added sugars, and salt. Eat the right amount of calories for you. Get information about a proper diet from your health care provider, if necessary.  Regular physical exercise is one of the most important things you can do for your health. Most adults should get at least 150  minutes of moderate-intensity exercise (any activity that increases your heart rate and causes you to sweat) each week. In addition, most adults need muscle-strengthening exercises on 2 or more days a week.  Maintain a healthy weight. The body mass index (BMI) is a screening tool to identify possible weight problems. It provides an estimate of body fat based on height and weight. Your health care provider can find your BMI and can help you achieve or maintain a healthy weight. For adults 20 years and older:  A BMI below 18.5 is considered underweight.  A BMI of 18.5 to 24.9 is normal.  A BMI of 25 to 29.9 is considered overweight.  A BMI of 30 and above   and above is considered obese.  Maintain normal blood lipids and cholesterol levels by exercising and minimizing your intake of saturated fat. Eat a balanced diet with plenty of fruit and vegetables. If your lipid or cholesterol levels are high, you are over 50, or you are at high risk for heart disease, you may need your cholesterol levels checked more frequently. Ongoing high lipid and cholesterol levels should be treated with medicines if diet and exercise are not working.  If you smoke, find out from your health care provider how to quit. If you do not use tobacco, do not start.  Lung cancer screening is recommended for adults aged 16-80 years who are at high risk for developing lung cancer because of a history of smoking. A yearly low-dose CT scan of the lungs is recommended for people who have at least a 30-pack-year history of smoking and are a current smoker or have quit within the past 15 years. A pack year of smoking is smoking an average of 1 pack of cigarettes a day for 1 year (for example: 1 pack a day for 30 years or 2 packs a day for 15 years). Yearly screening should continue until the smoker has stopped smoking for at least 15 years. Yearly screening should be stopped for people who develop a health problem that would prevent them from having lung cancer  treatment.  Avoid use of street drugs. Do not share needles with anyone. Ask for help if you need support or instructions about stopping the use of drugs.  High blood pressure causes heart disease and increases the risk of stroke.  Ongoing high blood pressure should be treated with medicines if weight loss and exercise do not work.  If you are 80-46 years old, ask your health care provider if you should take aspirin to prevent strokes.  Diabetes screening involves taking a blood sample to check your fasting blood sugar level. This should be done once every 3 years, after age 66, if you are within normal weight and without risk factors for diabetes. Testing should be considered at a younger age or be carried out more frequently if you are overweight and have at least 1 risk factor for diabetes.  Breast cancer screening is essential preventive care for women. You should practice "breast self-awareness." This means understanding the normal appearance and feel of your breasts and may include breast self-examination. Any changes detected, no matter how small, should be reported to a health care provider. Women in their 76s and 30s should have a clinical breast exam (CBE) by a health care provider as part of a regular health exam every 1 to 3 years. After age 41, women should have a CBE every year. Starting at age 86, women should consider having a mammogram (breast X-ray test) every year. Women who have a family history of breast cancer should talk to their health care provider about genetic screening. Women at a high risk of breast cancer should talk to their health care providers about having an MRI and a mammogram every year.  Breast cancer gene (BRCA)-related cancer risk assessment is recommended for women who have family members with BRCA-related cancers. BRCA-related cancers include breast, ovarian, tubal, and peritoneal cancers. Having family members with these cancers may be associated with an  increased risk for harmful changes (mutations) in the breast cancer genes BRCA1 and BRCA2. Results of the assessment will determine the need for genetic counseling and BRCA1 and BRCA2 testing.  Routine pelvic exams to screen for  cancer are no longer recommended for nonpregnant women who are considered low risk for cancer of the pelvic organs (ovaries, uterus, and vagina) and who do not have symptoms. Ask your health care provider if a screening pelvic exam is right for you.  If you have had past treatment for cervical cancer or a condition that could lead to cancer, you need Pap tests and screening for cancer for at least 20 years after your treatment. If Pap tests have been discontinued, your risk factors (such as having a new sexual partner) need to be reassessed to determine if screening should be resumed. Some women have medical problems that increase the chance of getting cervical cancer. In these cases, your health care provider may recommend more frequent screening and Pap tests.    Colorectal cancer can be detected and often prevented. Most routine colorectal cancer screening begins at the age of 25 years and continues through age 61 years. However, your health care provider may recommend screening at an earlier age if you have risk factors for colon cancer. On a yearly basis, your health care provider may provide home test kits to check for hidden blood in the stool. Use of a small camera at the end of a tube, to directly examine the colon (sigmoidoscopy or colonoscopy), can detect the earliest forms of colorectal cancer. Talk to your health care provider about this at age 50, when routine screening begins.  Direct exam of the colon should be repeated every 5-10 years through age 58 years, unless early forms of pre-cancerous polyps or small growths are found.  Osteoporosis is a disease in which the bones lose minerals and strength with aging. This can result in serious bone fractures or breaks.  The risk of osteoporosis can be identified using a bone density scan. Women ages 52 years and over and women at risk for fractures or osteoporosis should discuss screening with their health care providers. Ask your health care provider whether you should take a calcium supplement or vitamin D to reduce the rate of osteoporosis.  Menopause can be associated with physical symptoms and risks. Hormone replacement therapy is available to decrease symptoms and risks. You should talk to your health care provider about whether hormone replacement therapy is right for you.  Use sunscreen. Apply sunscreen liberally and repeatedly throughout the day. You should seek shade when your shadow is shorter than you. Protect yourself by wearing long sleeves, pants, a wide-brimmed hat, and sunglasses year round, whenever you are outdoors.  Once a month, do a whole body skin exam, using a mirror to look at the skin on your back. Tell your health care provider of new moles, moles that have irregular borders, moles that are larger than a pencil eraser, or moles that have changed in shape or color.  Stay current with required vaccines (immunizations).  Influenza vaccine. All adults should be immunized every year.  Tetanus, diphtheria, and acellular pertussis (Td, Tdap) vaccine. Pregnant women should receive 1 dose of Tdap vaccine during each pregnancy. The dose should be obtained regardless of the length of time since the last dose. Immunization is preferred during the 27th-36th week of gestation. An adult who has not previously received Tdap or who does not know her vaccine status should receive 1 dose of Tdap. This initial dose should be followed by tetanus and diphtheria toxoids (Td) booster doses every 10 years. Adults with an unknown or incomplete history of completing a 3-dose immunization series with Td-containing vaccines should begin or complete  a primary immunization series including a Tdap dose. Adults should receive  a Td booster every 10 years.    Zoster vaccine. One dose is recommended for adults aged 70 years or older unless certain conditions are present.    Pneumococcal 13-valent conjugate (PCV13) vaccine. When indicated, a person who is uncertain of her immunization history and has no record of immunization should receive the PCV13 vaccine. An adult aged 3 years or older who has certain medical conditions and has not been previously immunized should receive 1 dose of PCV13 vaccine. This PCV13 should be followed with a dose of pneumococcal polysaccharide (PPSV23) vaccine. The PPSV23 vaccine dose should be obtained at least 1 or more year(s) after the dose of PCV13 vaccine. An adult aged 77 years or older who has certain medical conditions and previously received 1 or more doses of PPSV23 vaccine should receive 1 dose of PCV13. The PCV13 vaccine dose should be obtained 1 or more years after the last PPSV23 vaccine dose.    Pneumococcal polysaccharide (PPSV23) vaccine. When PCV13 is also indicated, PCV13 should be obtained first. All adults aged 60 years and older should be immunized. An adult younger than age 29 years who has certain medical conditions should be immunized. Any person who resides in a nursing home or long-term care facility should be immunized. An adult smoker should be immunized. People with an immunocompromised condition and certain other conditions should receive both PCV13 and PPSV23 vaccines. People with human immunodeficiency virus (HIV) infection should be immunized as soon as possible after diagnosis. Immunization during chemotherapy or radiation therapy should be avoided. Routine use of PPSV23 vaccine is not recommended for American Indians, [place]atives, or people younger than 65 years unless there are medical conditions that require PPSV23 vaccine. When indicated, people who have unknown immunization and have no record of immunization should receive PPSV23 vaccine. One-time  revaccination 5 years after the first dose of PPSV23 is recommended for people aged 19-64 years who have chronic kidney failure, nephrotic syndrome, asplenia, or immunocompromised conditions. People who received 1-2 doses of PPSV23 before age 74 years should receive another dose of PPSV23 vaccine at age 25 years or later if at least 5 years have passed since the previous dose. Doses of PPSV23 are not needed for people immunized with PPSV23 at or after age 62 years.   Preventive Services / Frequency  Ages 26 years and over  Blood pressure check.  Lipid and cholesterol check.  Lung cancer screening. / Every year if you are aged 9-80 years and have a 30-pack-year history of smoking and currently smoke or have quit within the past 15 years. Yearly screening is stopped once you have quit smoking for at least 15 years or develop a health problem that would prevent you from having lung cancer treatment.  Clinical breast exam.** / Every year after age 31 years.   BRCA-related cancer risk assessment.** / For women who have family members with a BRCA-related cancer (breast, ovarian, tubal, or peritoneal cancers).  Mammogram.** / Every year beginning at age 1 years and continuing for as long as you are in good health. Consult with your health care provider.  Pap test.** / Every 3 years starting at age 80 years through age 57 or 7 years with 3 consecutive normal Pap tests. Testing can be stopped between 65 and 70 years with 3 consecutive normal Pap tests and no abnormal Pap or HPV tests in the past 10 years.  Fecal occult blood test (FOBT) of  stool. / Every year beginning at age 35 years and continuing until age 51 years. You may not need to do this test if you get a colonoscopy every 10 years.  Flexible sigmoidoscopy or colonoscopy.** / Every 5 years for a flexible sigmoidoscopy or every 10 years for a colonoscopy beginning at age 61 years and continuing until age 73 years.  Hepatitis C blood  test.** / For all people born from [birth year] through 1963 and any individual with known risks for hepatitis C.  Osteoporosis screening.** / A one-time screening for women ages 67 years and over and women at risk for fractures or osteoporosis.  Skin self-exam. / Monthly.  Influenza vaccine. / Every year.  Tetanus, diphtheria, and acellular pertussis (Tdap/Td) vaccine.** / 1 dose of Td every 10 years.  Zoster vaccine.** / 1 dose for adults aged 4 years or older.  Pneumococcal 13-valent conjugate (PCV13) vaccine.** / Consult your health care provider.  Pneumococcal polysaccharide (PPSV23) vaccine.** / 1 dose for all adults aged 16 years and older. Screening for abdominal aortic aneurysm (AAA)  by ultrasound is recommended for people who have history of high blood pressure or who are current or former smokers. ++++++++++++++++++++ Recommend Adult Low Dose Aspirin or  coated  Aspirin 81 mg daily  To reduce risk of Colon Cancer 40 %,  Skin Cancer 26 % ,  Melanoma 46%  and  Pancreatic cancer 60% ++++++++++++++++++++ Vitamin D goal  is between 70-100.  Please make sure that you are taking your Vitamin D as directed.  It is very important as a natural anti-inflammatory  helping hair, skin, and nails, as well as reducing stroke and heart attack risk.  It helps your bones and helps with mood. It also decreases numerous cancer risks so please take it as directed.  Low Vit D is associated with a 200-300% higher risk for CANCER  and 200-300% higher risk for HEART   ATTACK  &  STROKE.   .....................................Marland Kitchen It is also associated with higher death rate at younger ages,  autoimmune diseases like Rheumatoid arthritis, Lupus, Multiple Sclerosis.    Also many other serious conditions, like depression, Alzheimer's Dementia, infertility, muscle aches, fatigue, fibromyalgia - just to name a few. ++++++++++++++++++ Recommend the book "The END of DIETING" by Dr Excell Seltzer  & the book  "The END of DIABETES " by Dr Excell Seltzer At [email or web address] - get book & Audio CD's    Being diabetic has a  300% increased risk for heart attack, stroke, cancer, and alzheimer- type vascular dementia. It is very important that you work harder with diet by avoiding all foods that are white. Avoid white rice (brown & wild rice is OK), white potatoes (sweetpotatoes in moderation is OK), White bread or wheat bread or anything made out of white flour like bagels, donuts, rolls, buns, biscuits, cakes, pastries, cookies, pizza crust, and pasta (made from white flour & egg whites) - vegetarian pasta or spinach or wheat pasta is OK. Multigrain breads like Arnold's or [employer], or multigrain sandwich thins or flatbreads.  Diet, exercise and weight loss can reverse and cure diabetes in the early stages.  Diet, exercise and weight loss is very important in the control and prevention of complications of diabetes which affects every system in your body, ie. Brain - dementia/stroke, eyes - glaucoma/blindness, heart - heart attack/heart failure, kidneys - dialysis, stomach - gastric paralysis, intestines - malabsorption, nerves - severe painful neuritis, circulation - gangrene & loss of a leg(s), and finally  cancer and Alzheimers.    I recommend avoid fried & greasy foods,  sweets/candy, white rice (brown or wild rice or Quinoa is OK), white potatoes (sweet potatoes are OK) - anything made from white flour - bagels, doughnuts, rolls, buns, biscuits,white and wheat breads, pizza crust and traditional pasta made of white flour & egg white(vegetarian pasta or spinach or wheat pasta is OK).  Multi-grain bread is OK - like multi-grain flat bread or sandwich thins. Avoid alcohol in excess. Exercise is also important.    Eat all the vegetables you want - avoid meat, especially red meat and dairy - especially cheese.  Cheese is the most concentrated form of trans-fats which is the worst thing to clog up our arteries. Veggie  cheese is OK which can be found in the fresh produce section at [employer] or [employer] or [employer]  +++++++++++++++++++ DASH Eating Plan  DASH stands for "Dietary Approaches to Stop Hypertension."   The DASH eating plan is a healthy eating plan that has been shown to reduce high blood pressure (hypertension). Additional health benefits may include reducing the risk of type 2 diabetes mellitus, heart disease, and stroke. The DASH eating plan may also help with weight loss. WHAT DO I NEED TO KNOW ABOUT THE DASH EATING PLAN? For the DASH eating plan, you will follow these general guidelines:  Choose foods with a percent daily value for sodium of less than 5% (as listed on the food label).  Use salt-free seasonings or herbs instead of table salt or sea salt.  Check with your health care provider or pharmacist before using salt substitutes.  Eat lower-sodium products, often labeled as "lower sodium" or "no salt added."  Eat fresh foods.  Eat more vegetables, fruits, and low-fat dairy products.  Choose whole grains. Look for the word "whole" as the first word in the ingredient list.  Choose fish   Limit sweets, desserts, sugars, and sugary drinks.  Choose heart-healthy fats.  Eat veggie cheese   Eat more home-cooked food and less restaurant, buffet, and fast food.  Limit fried foods.  Cook foods using methods other than frying.  Limit canned vegetables. If you do use them, rinse them well to decrease the sodium.  When eating at a restaurant, ask that your food be prepared with less salt, or no salt if possible.                      WHAT FOODS CAN I EAT? Read Dr Fara Olden Fuhrman's books on The End of Dieting & The End of Diabetes  Grains Whole grain or whole wheat bread. Brown rice. Whole grain or whole wheat pasta. Quinoa, bulgur, and whole grain cereals. Low-sodium cereals. Corn or whole wheat flour tortillas. Whole grain cornbread. Whole grain crackers. Low-sodium  crackers.  Vegetables Fresh or frozen vegetables (raw, steamed, roasted, or grilled). Low-sodium or reduced-sodium tomato and vegetable juices. Low-sodium or reduced-sodium tomato sauce and paste. Low-sodium or reduced-sodium canned vegetables.   Fruits All fresh, canned (in natural juice), or frozen fruits.  Protein Products  All fish and seafood.  Dried beans, peas, or lentils. Unsalted nuts and seeds. Unsalted canned beans.  Dairy Low-fat dairy products, such as skim or 1% milk, 2% or reduced-fat cheeses, low-fat ricotta or cottage cheese, or plain low-fat yogurt. Low-sodium or reduced-sodium cheeses.  Fats and Oils Tub margarines without trans fats. Light or reduced-fat mayonnaise and salad dressings (reduced sodium). Avocado. Safflower, olive, or canola oils. Natural peanut or almond  butter.  Other Unsalted popcorn and pretzels. The items listed above may not be a complete list of recommended foods or beverages. Contact your dietitian for more options.  +++++++++++++++  WHAT FOODS ARE NOT RECOMMENDED? Grains/ White flour or wheat flour White bread. White pasta. White rice. Refined cornbread. Bagels and croissants. Crackers that contain trans fat.  Vegetables  Creamed or fried vegetables. Vegetables in a . Regular canned vegetables. Regular canned tomato sauce and paste. Regular tomato and vegetable juices.  Fruits Dried fruits. Canned fruit in light or heavy syrup. Fruit juice.  Meat and Other Protein Products Meat in general - RED meat & White meat.  Fatty cuts of meat. Ribs, chicken wings, all processed meats as bacon, sausage, bologna, salami, fatback, hot dogs, bratwurst and packaged luncheon meats.  Dairy Whole or 2% milk, cream, half-and-half, and cream cheese. Whole-fat or sweetened yogurt. Full-fat cheeses or blue cheese. Non-dairy creamers and whipped toppings. Processed cheese, cheese spreads, or cheese curds.  Condiments Onion and garlic salt, seasoned salt,  table salt, and sea salt. Canned and packaged gravies. Worcestershire sauce. Tartar sauce. Barbecue sauce. Teriyaki sauce. Soy sauce, including reduced sodium. Steak sauce. Fish sauce. Oyster sauce. Cocktail sauce. Horseradish. Ketchup and mustard. Meat flavorings and tenderizers. Bouillon cubes. Hot sauce. Tabasco sauce. Marinades. Taco seasonings. Relishes.  Fats and Oils Butter, stick margarine, lard, shortening and bacon fat. Coconut, palm kernel, or palm oils. Regular salad dressings.  Pickles and olives. Salted popcorn and pretzels.  The items listed above may not be a complete list of foods and beverages to avoid.

## 2020-09-07 ENCOUNTER — Other Ambulatory Visit: Payer: Self-pay | Admitting: *Deleted

## 2020-09-07 ENCOUNTER — Other Ambulatory Visit: Payer: Self-pay

## 2020-09-07 ENCOUNTER — Ambulatory Visit (INDEPENDENT_AMBULATORY_CARE_PROVIDER_SITE_OTHER): Payer: PPO | Admitting: Internal Medicine

## 2020-09-07 VITALS — BP 118/80 | HR 72 | Temp 97.0°F | Resp 16 | Ht 66.0 in | Wt 133.0 lb

## 2020-09-07 DIAGNOSIS — Z136 Encounter for screening for cardiovascular disorders: Secondary | ICD-10-CM

## 2020-09-07 DIAGNOSIS — R7309 Other abnormal glucose: Secondary | ICD-10-CM | POA: Diagnosis not present

## 2020-09-07 DIAGNOSIS — Z0000 Encounter for general adult medical examination without abnormal findings: Secondary | ICD-10-CM | POA: Diagnosis not present

## 2020-09-07 DIAGNOSIS — Z1211 Encounter for screening for malignant neoplasm of colon: Secondary | ICD-10-CM

## 2020-09-07 DIAGNOSIS — Z23 Encounter for immunization: Secondary | ICD-10-CM

## 2020-09-07 DIAGNOSIS — E559 Vitamin D deficiency, unspecified: Secondary | ICD-10-CM

## 2020-09-07 DIAGNOSIS — R030 Elevated blood-pressure reading, without diagnosis of hypertension: Secondary | ICD-10-CM | POA: Diagnosis not present

## 2020-09-07 DIAGNOSIS — Z8249 Family history of ischemic heart disease and other diseases of the circulatory system: Secondary | ICD-10-CM

## 2020-09-07 DIAGNOSIS — Z79899 Other long term (current) drug therapy: Secondary | ICD-10-CM | POA: Diagnosis not present

## 2020-09-07 DIAGNOSIS — Z13220 Encounter for screening for lipoid disorders: Secondary | ICD-10-CM | POA: Diagnosis not present

## 2020-09-07 DIAGNOSIS — Z0001 Encounter for general adult medical examination with abnormal findings: Secondary | ICD-10-CM

## 2020-09-07 MED ORDER — SULFACETAMIDE SODIUM (ACNE) 10 % EX LOTN: TOPICAL_LOTION | CUTANEOUS | 0 refills | Status: AC

## 2020-09-07 MED ORDER — ACYCLOVIR 5 % EX OINT: 1.0000 "application " | TOPICAL_OINTMENT | CUTANEOUS | 3 refills | Status: AC

## 2020-09-08 LAB — LIPID PANEL
Cholesterol: 176 mg/dL (ref <200)
HDL: 101 mg/dL (ref >=50)
LDL Cholesterol (Calc): 59 mg/dL (calc)
Non-HDL Cholesterol (Calc): 75 mg/dL (calc) (ref <130)
Total CHOL/HDL Ratio: 1.7 (calc) (ref <5.0)
Triglycerides: 76 mg/dL (ref <150)

## 2020-09-08 LAB — CBC WITH DIFFERENTIAL/PLATELET
Absolute Monocytes: 358 cells/uL (ref 200–950)
Basophils Absolute: 28 cells/uL (ref 0–200)
Basophils Relative: 0.5 %
Eosinophils Absolute: 129 cells/uL (ref 15–500)
Eosinophils Relative: 2.3 %
HCT: 36.5 % (ref 35.0–45.0)
Hemoglobin: 12.4 g/dL (ref 11.7–15.5)
Lymphs Abs: 1266 cells/uL (ref 850–3900)
MCH: 30.4 pg (ref 27.0–33.0)
MCHC: 34 g/dL (ref 32.0–36.0)
MCV: 89.5 fL (ref 80.0–100.0)
MPV: 9.7 fL (ref 7.5–12.5)
Monocytes Relative: 6.4 %
Neutro Abs: 3819 cells/uL (ref 1500–7800)
Neutrophils Relative %: 68.2 %
Platelets: 293 10*3/uL (ref 140–400)
RBC: 4.08 10*6/uL (ref 3.80–5.10)
RDW: 12.7 % (ref 11.0–15.0)
Total Lymphocyte: 22.6 %
WBC: 5.6 10*3/uL (ref 3.8–10.8)

## 2020-09-08 LAB — URINALYSIS, ROUTINE W REFLEX MICROSCOPIC
Bilirubin Urine: NEGATIVE
Glucose, UA: NEGATIVE
Hgb urine dipstick: NEGATIVE
Ketones, ur: NEGATIVE
Leukocytes,Ua: NEGATIVE
Nitrite: NEGATIVE
Protein, ur: NEGATIVE
Specific Gravity, Urine: 1.004 (ref 1.001–1.03)
pH: 6.5 (ref 5.0–8.0)

## 2020-09-08 LAB — COMPLETE METABOLIC PANEL WITH GFR
AG Ratio: 2 (calc) (ref 1.0–2.5)
ALT: 15 U/L (ref 6–29)
AST: 17 U/L (ref 10–35)
Albumin: 4.4 g/dL (ref 3.6–5.1)
Alkaline phosphatase (APISO): 45 U/L (ref 37–153)
BUN: 15 mg/dL (ref 7–25)
CO2: 31 mmol/L (ref 20–32)
Calcium: 9.7 mg/dL (ref 8.6–10.4)
Chloride: 101 mmol/L (ref 98–110)
Creat: 0.66 mg/dL (ref 0.50–0.99)
GFR, Est African American: 106 mL/min/{1.73_m2} (ref >=60)
GFR, Est Non African American: 91 mL/min/{1.73_m2} (ref >=60)
Globulin: 2.2 g/dL (calc) (ref 1.9–3.7)
Glucose, Bld: 94 mg/dL (ref 65–99)
Potassium: 4.6 mmol/L (ref 3.5–5.3)
Sodium: 138 mmol/L (ref 135–146)
Total Bilirubin: 0.5 mg/dL (ref 0.2–1.2)
Total Protein: 6.6 g/dL (ref 6.1–8.1)

## 2020-09-08 LAB — MICROALBUMIN / CREATININE URINE RATIO
Creatinine, Urine: 19 mg/dL — ABNORMAL LOW (ref 20–275)
Microalb, Ur: <0.2 mg/dL

## 2020-09-08 LAB — VITAMIN D 25 HYDROXY (VIT D DEFICIENCY, FRACTURES): Vit D, 25-Hydroxy: 56 ng/mL (ref 30–100)

## 2020-09-08 LAB — HEMOGLOBIN A1C
Hgb A1c MFr Bld: 5 % of total Hgb (ref <5.7)
Mean Plasma Glucose: 97 (calc)
eAG (mmol/L): 5.4 (calc)

## 2020-09-08 LAB — TSH: TSH: 3.17 mIU/L (ref 0.40–4.50)

## 2020-09-08 LAB — MAGNESIUM: Magnesium: 2.3 mg/dL (ref 1.5–2.5)

## 2020-09-08 LAB — INSULIN, RANDOM: Insulin: 3.8 u[IU]/mL

## 2020-09-08 NOTE — Progress Notes (Signed)
========================================================== -   Test results slightly outside the reference range are not unusual. If there is anything important, I will review this with you,  otherwise it is considered normal test values.  If you have further questions,  please do not hesitate to contact me at the office or via My Chart.  ==========================================================  -  Total Chol = 176      and      LDL Chol = 59     - Both  Excellent   - Very low risk for Heart Attack  / Stroke =============================================================  - A1c - Normal - Great - No Diabetes  ! ==========================================================  -  Vitamin D  = 56 - a little lower than last 67  - Vitamin D goal is between 70-100.   - Please INCREASE your Vitamin D 5,000 unit caps to 10 caps /week,   Ie.,             take                     2 caps 3 x /week on Mon Wed Fri &                     1 cap 4 x /week on TThSS ==========================================================  - Vitamin D  is very important as a natural anti-inflammatory and helping the  immune system protect against viral infections, like the Covid-19   helping hair, skin, and nails, as well as reducing stroke and  heart attack risk.   - It helps your bones and helps with mood.  - It also decreases numerous cancer risks so please  take it as directed.   - Low Vit D is associated with a 200-300% higher risk for  CANCER   and 200-300% higher risk for HEART   ATTACK  &  STROKE.    - It is also associated with higher death rate at younger ages,   autoimmune diseases like Rheumatoid arthritis, Lupus,  Multiple Sclerosis.     - Also many other serious conditions, like depression, Alzheimer's  Dementia, infertility, muscle aches, fatigue, fibromyalgia   - just to name a few. ==========================================================  - All Else - CBC - Kidneys - Electrolytes -  Liver - Magnesium & Thyroid    - all  Normal / OK ==========================================================   - Keep up the [place] Work   ! ==========================================================

## 2020-09-26 ENCOUNTER — Encounter: Payer: PPO | Admitting: Internal Medicine

## 2020-09-28 DIAGNOSIS — M545 Low back pain, unspecified: Secondary | ICD-10-CM | POA: Diagnosis not present

## 2020-09-28 DIAGNOSIS — M419 Scoliosis, unspecified: Secondary | ICD-10-CM | POA: Diagnosis not present

## 2020-09-28 DIAGNOSIS — M5136 Other intervertebral disc degeneration, lumbar region: Secondary | ICD-10-CM | POA: Diagnosis not present

## 2020-10-02 DIAGNOSIS — M5136 Other intervertebral disc degeneration, lumbar region: Secondary | ICD-10-CM | POA: Diagnosis not present

## 2020-10-10 DIAGNOSIS — M5136 Other intervertebral disc degeneration, lumbar region: Secondary | ICD-10-CM | POA: Diagnosis not present

## 2020-10-12 DIAGNOSIS — M5136 Other intervertebral disc degeneration, lumbar region: Secondary | ICD-10-CM | POA: Diagnosis not present

## 2020-10-16 DIAGNOSIS — M5136 Other intervertebral disc degeneration, lumbar region: Secondary | ICD-10-CM | POA: Diagnosis not present

## 2020-10-25 DIAGNOSIS — M5136 Other intervertebral disc degeneration, lumbar region: Secondary | ICD-10-CM | POA: Diagnosis not present

## 2020-10-27 DIAGNOSIS — M545 Low back pain, unspecified: Secondary | ICD-10-CM | POA: Diagnosis not present

## 2020-10-27 DIAGNOSIS — M25561 Pain in right knee: Secondary | ICD-10-CM | POA: Diagnosis not present

## 2020-10-27 DIAGNOSIS — M5136 Other intervertebral disc degeneration, lumbar region: Secondary | ICD-10-CM | POA: Diagnosis not present

## 2020-10-30 DIAGNOSIS — M5136 Other intervertebral disc degeneration, lumbar region: Secondary | ICD-10-CM | POA: Diagnosis not present

## 2020-11-15 DIAGNOSIS — M5136 Other intervertebral disc degeneration, lumbar region: Secondary | ICD-10-CM | POA: Diagnosis not present

## 2020-11-15 DIAGNOSIS — M25561 Pain in right knee: Secondary | ICD-10-CM | POA: Diagnosis not present

## 2020-11-22 DIAGNOSIS — M5459 Other low back pain: Secondary | ICD-10-CM | POA: Diagnosis not present

## 2020-11-22 DIAGNOSIS — M25561 Pain in right knee: Secondary | ICD-10-CM | POA: Diagnosis not present

## 2020-12-06 DIAGNOSIS — M25561 Pain in right knee: Secondary | ICD-10-CM | POA: Diagnosis not present

## 2021-03-07 ENCOUNTER — Encounter: Payer: Self-pay | Admitting: Radiology

## 2021-04-02 ENCOUNTER — Telehealth: Payer: Self-pay | Admitting: *Deleted

## 2021-04-02 ENCOUNTER — Other Ambulatory Visit: Payer: Self-pay | Admitting: Internal Medicine

## 2021-04-02 MED ORDER — IPRATROPIUM BROMIDE 0.06 % NA SOLN
NASAL | 2 refills | Status: DC
Start: 2021-04-02 — End: 2021-09-19

## 2021-04-02 MED ORDER — AZITHROMYCIN 250 MG PO TABS: ORAL_TABLET | ORAL | 1 refills | Status: DC

## 2021-04-02 MED ORDER — DEXAMETHASONE 4 MG PO TABS: ORAL_TABLET | ORAL | 0 refills | Status: DC

## 2021-04-02 NOTE — Telephone Encounter (Signed)
Returned call to patient regarding positive Covid test with sinus symptoms. Per Dr Melford Aase, take Vitamin C 1000 mg , continue Vitamin D 5000 units and add Zinc 50 mg daily. Dr Melford Aase will send RX to [facility] for the patient.

## 2021-04-03 ENCOUNTER — Ambulatory Visit: Payer: PPO | Admitting: Adult Health

## 2021-04-13 ENCOUNTER — Encounter: Payer: Self-pay | Admitting: Adult Health

## 2021-04-13 DIAGNOSIS — M85852 Other specified disorders of bone density and structure, left thigh: Secondary | ICD-10-CM | POA: Insufficient documentation

## 2021-04-13 DIAGNOSIS — T39395A Adverse effect of other nonsteroidal anti-inflammatory drugs [NSAID], initial encounter: Secondary | ICD-10-CM

## 2021-04-13 DIAGNOSIS — K2960 Other gastritis without bleeding: Secondary | ICD-10-CM

## 2021-04-13 DIAGNOSIS — K219 Gastro-esophageal reflux disease without esophagitis: Secondary | ICD-10-CM | POA: Insufficient documentation

## 2021-04-13 HISTORY — DX: Other gastritis without bleeding: K29.60

## 2021-04-13 HISTORY — DX: Other gastritis without bleeding: T39.395A

## 2021-04-13 NOTE — Progress Notes (Signed)
MEDICARE ANNUAL WELLNESS VISIT AND FOLLOW UP   Assessment:   Encounter for Medicare annual wellness exam 1 year  Vitamin D deficiency Continue supplement  B12 deficiency Continue supplement  Irritable bowel syndrome, unspecified type Recently well controlled; encourage fiber, avoid triggers  Elevated BP without diagnosis of hypertension - well controlled/at goal off of medications, DASH diet, exercise and monitor at home. Call if greater than 130/80.   Estrogen deficiency Continue bASA and patch via GYN  GERD Well managed on current medications  Discussed diet, avoiding triggers and other lifestyle changes   Over 30 minutes of exam, counseling, chart review and critical decision making was performed Future Appointments  Date Time Provider [place]  09/20/2021  2:00 PM Unk Pinto, MD [facility] None  04/16/2022  2:30 PM Liane Comber, NP [facility] None     Plan:   During the course of the visit the patient was educated and counseled about appropriate screening and preventive services including:    Pneumococcal vaccine   Prevnar 13  Influenza vaccine  Td vaccine  Screening electrocardiogram  Bone densitometry screening  Colorectal cancer screening  Diabetes screening  Glaucoma screening  Nutrition counseling   Advanced directives: requested   Subjective:  Kimberly Murphy is a 70 y.o. female who presents for Medicare Annual Wellness Visit and 3 month follow up.   She had mild case of covid 19 2 weeks ago, completed course of dexamethazone and zpak course and feels essentially resolved. Has been taking sudafed due to congestion/ear pressure.   Has bil knee pain, R>L, arthritis, also back with bulging disc, follows with ortho, takes advil prior to golfing, discussing gel injections. Has been given exercises.   On estradiol patch and progesterone via GYN, Dr. Darron Doom.   IBS/GERD, on nexium 40 mg daily.   BMI is Body  mass index is 21.14 kg/m., she has been working on diet and exercise, has stationary bike, doing 2-3 days per week 10 min each, golfs several time a week, some walking in between weather permitting.  Wt Readings from Last 3 Encounters:  04/16/21 131 lb (59.4 kg)  09/07/20 133 lb (60.3 kg)  07/20/20 133 lb (60.3 kg)   Today their BP is BP: 124/76   She does workout. She denies chest pain, shortness of breath, dizziness.  She is not on cholesterol medication and denies myalgias. Her cholesterol is at goal. The cholesterol last visit was:   Lab Results  Component Value Date   CHOL 176 09/07/2020   HDL 101 09/07/2020   LDLCALC 59 09/07/2020   TRIG 76 09/07/2020   CHOLHDL 1.7 09/07/2020   Last A1C in the office was:  Lab Results  Component Value Date   HGBA1C 5.0 09/07/2020   Last GFR: Lab Results  Component Value Date   GFRNONAA 91 09/07/2020   Patient is on Vitamin D supplement.   Lab Results  Component Value Date   VD25OH 22 09/07/2020     She is on B12 sublingual for hx of deficiency with normalized values:  Lab Results  Component Value Date   [id number] 876 09/03/2018     Medication Review:  Current Outpatient Medications (Endocrine & Metabolic):  .  estradiol (VIVELLE-DOT) 0.0375 MG/24HR, Place 1 patch onto the skin 2 (two) times a week. .  progesterone (PROMETRIUM) 200 MG capsule, Takes 1 capsule for 12 days alternate months   Current Outpatient Medications (Respiratory):  .  ipratropium (ATROVENT) 0.06 % nasal spray, Use 1 to 2 sprays to  each nostril 3 to 4 x/day as needed for Rhinorrhea  Current Outpatient Medications (Analgesics):  .  aspirin 81 MG tablet, Take 81 mg by mouth daily.  Current Outpatient Medications (Hematological):  Marland Kitchen  Cyanocobalamin (B-12) 1000 MCG SUBL, Place 1,000 mcg under the tongue daily.   Current Outpatient Medications (Other):  .  acyclovir ointment (ZOVIRAX) 5 %, Apply 1 application topically every 3 (three) hours. Marland Kitchen  BIOTIN  PO, Take 1 capsule by mouth daily. Marland Kitchen  CALCIUM CITRATE PO, Take 1 tablet by mouth daily.  .  Cholecalciferol (VITAMIN D-3) 125 MCG (5000 UT) TABS, Takes 5,000 units Daily .  esomeprazole (NEXIUM) 40 MG capsule, Take 1 capsule Daily to Prevent Heartburn / Indigestion .  hyoscyamine (LEVSIN) 0.125 MG tablet, Take 1 tablet (0.125 mg total) by mouth every 4 (four) hours as needed for cramping (diarrhea, nausea). .  Magnesium 250 MG TABS, Take 250 mg by mouth daily.  .  Sulfacetamide Sodium, Acne, 10 % LOTN, Apply 1 to 2 times daily.  Allergies Allergies  Allergen Reactions  . Pseudoephedrine-Dm-Gg     Increased Heart rate  . Seldane [Terfenadine]     Rapid palpitations  . Shellfish Allergy     N/V, diarrhea    Current Problems (verified) Patient Active Problem List   Diagnosis Date Noted  . Osteopenia of neck of left femur 04/13/2021  . Gastroesophageal reflux disease without esophagitis 04/13/2021  . Estrogen deficiency 03/11/2019  . Body mass index (BMI) of 20.0-20.9 in adult 09/23/2015  . IBS (irritable bowel syndrome)   . Vitamin D deficiency   . B12 deficiency     Screening Tests Immunization History  Administered Date(s) Administered  . DT (Pediatric) 09/28/2014  . Influenza Inj Mdck Quad With Preservative 08/14/2017  . Influenza Split 09/28/2013, 09/28/2014  . Influenza, High Dose Seasonal PF 09/03/2018, 08/23/2019, 09/07/2020  . Influenza, Seasonal, Injecte, Preservative Fre 09/22/2015  . Influenza,inj,quad, With Preservative 09/27/2016  . PFIZER(Purple Top)SARS-COV-2 Vaccination 12/04/2019, 12/25/2019, 08/18/2020, 03/13/2021  . PPD Test 09/28/2013, 08/14/2017  . Pneumococcal Conjugate-13 09/28/2014  . Pneumococcal Polysaccharide-23 09/03/2018  . Pneumococcal-Unspecified 11/11/1998  . Td 11/12/2003  . Zoster, Live 11/11/2005   Preventative care: Last colonoscopy: 2009 Dr. Henrene Pastor Cologuard negative 08/2018 Last mammogram: 08/2020 Last pap smear/pelvic exam: 04/2019  normal, Dr. Hulan Fray, abnormal in 2019,  DEXA: 08/2020 -1.1 L fem, osteopenia,   Prior vaccinations: TD or Tdap: 2015  Influenza: 08/2020  Pneumococcal: 2015 Prevnar13: 2019 Shingles/Zostavax: 2007, had significant IgG titer in 08/2018, declines  Covid 19: 2/2, 2021, pfizer + 2 boosters  Names of Other Physician/Practitioners you currently use: 1. [facility] here for primary care 2. Last eye exam 04/2020, Dr. Prudencio Burly and [facility] Pain 3. Last dental exam, 2022, Dr. Greggory Stallion  Patient Care Team: Unk Pinto, MD as PCP - General (Internal Medicine)  SURGICAL HISTORY She  has a past surgical history that includes Dilation and curettage of uterus ([place]). FAMILY HISTORY Her family history includes Arthritis in her father; Breast cancer in her paternal aunt; Breast cancer (age of onset: 31) in her mother; Cancer in her mother; Hypertension in her father; Macular degeneration in her father; Ulcerative colitis in her mother. SOCIAL HISTORY She  reports that she has never smoked. She has never used smokeless tobacco. She reports current alcohol use of about 14.0 standard drinks of alcohol per week. She reports previous drug use.   MEDICARE WELLNESS OBJECTIVES: Physical activity: Current Exercise Habits: Home exercise routine, Type of exercise:  treadmill;walking;Other - see comments, Time (Minutes): 45, Frequency (Times/Week): 5, Weekly Exercise (Minutes/Week): 225, Intensity: Mild, Exercise limited by: orthopedic condition(s) Cardiac risk factors: Cardiac Risk Factors include: advanced age (>31men, >19 women);dyslipidemia;hypertension Depression/mood screen:   Depression screen [facility] 2/9 04/16/2021  Decreased Interest 0  Down, Depressed, Hopeless 0  PHQ - 2 Score 0    ADLs:  In your present state of health, do you have any difficulty performing the following activities: 04/16/2021 07/20/2020  Hearing? N N  Vision? N N  Difficulty concentrating or making  decisions? N N  Walking or climbing stairs? N N  Dressing or bathing? N N  Doing errands, shopping? N N  Some recent data might be hidden     Cognitive Testing  Alert? Yes  Normal Appearance?Yes  Oriented to person? Yes  Place? Yes   Time? Yes  Recall of three objects?  Yes  Can perform simple calculations? Yes  Displays appropriate judgment?Yes  Can read the correct time from a watch face?Yes  EOL planning: Does Patient Have a Medical Advance Directive?: Yes Type of Advance Directive: [place] will Does patient want to make changes to medical advance directive?: No - Patient declined Copy of [place] in Chart?: No - copy requested  Review of Systems  Constitutional: Negative.  Negative for malaise/fatigue and weight loss.  HENT: Negative.  Negative for hearing loss and tinnitus.   Eyes: Negative.  Negative for blurred vision and double vision.  Respiratory: Negative.  Negative for cough, sputum production, shortness of breath and wheezing.   Cardiovascular: Negative.  Negative for chest pain, palpitations, orthopnea, claudication, leg swelling and PND.  Gastrointestinal: Negative.  Negative for abdominal pain, blood in stool, constipation, diarrhea, heartburn, melena, nausea and vomiting.  Genitourinary: Negative.   Musculoskeletal: Positive for back pain (intermittent, improved) and joint pain (bil knees, R >L). Negative for falls and myalgias.  Skin: Negative.  Negative for rash.  Neurological: Negative.  Negative for dizziness, tingling, sensory change, weakness and headaches.  Endo/Heme/Allergies: Negative.  Negative for polydipsia.  Psychiatric/Behavioral: Negative.  Negative for depression, memory loss, substance abuse and suicidal ideas. The patient is not nervous/anxious and does not have insomnia.   All other systems reviewed and are negative.    Objective:     Today's Vitals   04/16/21 1433  BP: 124/76  Pulse: 87   Temp: (!) 97.5 F (36.4 C)  SpO2: 99%  Weight: 131 lb (59.4 kg)   Body mass index is 21.14 kg/m.  Eyes: PERRLA, EOMs, conjunctiva no swelling or erythema Sinuses: No frontal/maxillary tenderness ENT/Mouth: EACs patent / TMs  nl. Nares clear without erythema, swelling, mucoid exudates. Oral hygiene is good. No erythema, swelling, or exudate. Tongue normal, non-obstructing. Tonsils not swollen or erythematous. Hearing normal.  Neck: Supple, thyroid not palpable. No bruits, nodes or JVD. Respiratory: Respiratory effort normal.  BS equal and clear bilateral without rales, rhonci, wheezing or stridor. Cardio: Heart sounds are normal with regular rate and rhythm and no murmurs, rubs or gallops. Peripheral pulses are normal and equal bilaterally without edema. No aortic or femoral bruits. Chest: symmetric with normal excursions and percussion. Abdomen: Flat, soft with bowel sounds active. Nontender, no guarding, rebound, hernias, masses, or organomegaly.  Lymphatics: Non tender without lymphadenopathy.  Musculoskeletal: Full ROM all peripheral extremities, joint stability, 5/5 strength, and normal gait. Skin: Warm and dry without rashes, lesions, cyanosis, clubbing or  ecchymosis.  Neuro: Cranial nerves intact, reflexes equal bilaterally. Normal muscle tone,  no cerebellar symptoms. Sensation intact.  Pysch: Alert and oriented X 3, normal affect, Insight and Judgment appropriate.    Medicare Attestation I have personally reviewed: The patient's medical and social history Their use of alcohol, tobacco or illicit drugs Their current medications and supplements The patient's functional ability including ADLs,fall risks, home safety risks, cognitive, and hearing and visual impairment Diet and physical activities Evidence for depression or mood disorders  The patient's weight, height, BMI, and visual acuity have been recorded in the chart.  I have made referrals, counseling, and provided  education to the patient based on review of the above and I have provided the patient with a written personalized care plan for preventive services.     Izora Ribas, NP   04/16/2021

## 2021-04-16 ENCOUNTER — Ambulatory Visit (INDEPENDENT_AMBULATORY_CARE_PROVIDER_SITE_OTHER): Payer: PPO | Admitting: Adult Health

## 2021-04-16 ENCOUNTER — Encounter: Payer: Self-pay | Admitting: Adult Health

## 2021-04-16 ENCOUNTER — Other Ambulatory Visit: Payer: Self-pay

## 2021-04-16 VITALS — BP 124/76 | HR 87 | Temp 97.5°F | Wt 131.0 lb

## 2021-04-16 DIAGNOSIS — E559 Vitamin D deficiency, unspecified: Secondary | ICD-10-CM | POA: Diagnosis not present

## 2021-04-16 DIAGNOSIS — K219 Gastro-esophageal reflux disease without esophagitis: Secondary | ICD-10-CM | POA: Diagnosis not present

## 2021-04-16 DIAGNOSIS — Z6820 Body mass index (BMI) 20.0-20.9, adult: Secondary | ICD-10-CM | POA: Diagnosis not present

## 2021-04-16 DIAGNOSIS — E538 Deficiency of other specified B group vitamins: Secondary | ICD-10-CM

## 2021-04-16 DIAGNOSIS — R6889 Other general symptoms and signs: Secondary | ICD-10-CM

## 2021-04-16 DIAGNOSIS — M85852 Other specified disorders of bone density and structure, left thigh: Secondary | ICD-10-CM

## 2021-04-16 DIAGNOSIS — E2839 Other primary ovarian failure: Secondary | ICD-10-CM | POA: Diagnosis not present

## 2021-04-16 DIAGNOSIS — Z0001 Encounter for general adult medical examination with abnormal findings: Secondary | ICD-10-CM | POA: Diagnosis not present

## 2021-04-16 DIAGNOSIS — Z0000 Encounter for general adult medical examination without abnormal findings: Secondary | ICD-10-CM

## 2021-04-16 DIAGNOSIS — K589 Irritable bowel syndrome without diarrhea: Secondary | ICD-10-CM

## 2021-04-20 ENCOUNTER — Encounter: Payer: Self-pay | Admitting: Adult Health

## 2021-04-20 ENCOUNTER — Other Ambulatory Visit: Payer: Self-pay | Admitting: Adult Health

## 2021-05-03 DIAGNOSIS — D225 Melanocytic nevi of trunk: Secondary | ICD-10-CM | POA: Diagnosis not present

## 2021-05-03 DIAGNOSIS — L309 Dermatitis, unspecified: Secondary | ICD-10-CM | POA: Diagnosis not present

## 2021-05-03 DIAGNOSIS — D2271 Melanocytic nevi of right lower limb, including hip: Secondary | ICD-10-CM | POA: Diagnosis not present

## 2021-05-03 DIAGNOSIS — L821 Other seborrheic keratosis: Secondary | ICD-10-CM | POA: Diagnosis not present

## 2021-05-03 DIAGNOSIS — D1801 Hemangioma of skin and subcutaneous tissue: Secondary | ICD-10-CM | POA: Diagnosis not present

## 2021-05-03 DIAGNOSIS — L814 Other melanin hyperpigmentation: Secondary | ICD-10-CM | POA: Diagnosis not present

## 2021-05-03 DIAGNOSIS — D2261 Melanocytic nevi of right upper limb, including shoulder: Secondary | ICD-10-CM | POA: Diagnosis not present

## 2021-05-08 ENCOUNTER — Other Ambulatory Visit: Payer: Self-pay | Admitting: Family Medicine

## 2021-05-28 DIAGNOSIS — H04123 Dry eye syndrome of bilateral lacrimal glands: Secondary | ICD-10-CM | POA: Diagnosis not present

## 2021-05-28 DIAGNOSIS — H524 Presbyopia: Secondary | ICD-10-CM | POA: Diagnosis not present

## 2021-05-28 DIAGNOSIS — H2513 Age-related nuclear cataract, bilateral: Secondary | ICD-10-CM | POA: Diagnosis not present

## 2021-05-28 DIAGNOSIS — H5203 Hypermetropia, bilateral: Secondary | ICD-10-CM | POA: Diagnosis not present

## 2021-06-13 ENCOUNTER — Encounter: Payer: Self-pay | Admitting: Family Medicine

## 2021-06-13 ENCOUNTER — Other Ambulatory Visit: Payer: Self-pay

## 2021-06-13 ENCOUNTER — Other Ambulatory Visit (HOSPITAL_COMMUNITY)
Admission: RE | Admit: 2021-06-13 | Discharge: 2021-06-13 | Disposition: A | Discharge status: Home / Self Care | Payer: PPO | Source: Ambulatory Visit | Attending: Family Medicine | Admitting: Family Medicine

## 2021-06-13 ENCOUNTER — Ambulatory Visit (INDEPENDENT_AMBULATORY_CARE_PROVIDER_SITE_OTHER): Payer: PPO | Admitting: Family Medicine

## 2021-06-13 VITALS — BP 123/76 | HR 88 | Wt 130.4 lb

## 2021-06-13 DIAGNOSIS — Z1151 Encounter for screening for human papillomavirus (HPV): Secondary | ICD-10-CM | POA: Diagnosis not present

## 2021-06-13 DIAGNOSIS — E2839 Other primary ovarian failure: Secondary | ICD-10-CM

## 2021-06-13 DIAGNOSIS — Z124 Encounter for screening for malignant neoplasm of cervix: Secondary | ICD-10-CM | POA: Diagnosis not present

## 2021-06-13 MED ORDER — PROGESTERONE 200 MG PO CAPS
ORAL_CAPSULE | ORAL | 3 refills | Status: DC
Start: 2021-06-13 — End: 2022-03-27

## 2021-06-13 NOTE — Assessment & Plan Note (Signed)
Still with intact uterus, needs estrogen and progesterone. If her newest generic is not sticking well, may need to not use generic. Will monitor. Return if any bleeding noted.

## 2021-06-13 NOTE — Progress Notes (Signed)
   Subjective:    Patient ID: Kimberly Murphy is a 70 y.o. female presenting with Routine Prenatal Visit  on 06/13/2021  HPI: Patient is here for discussion of her HRT. On Vivelle, working well. New generic may not stick as well, and we will see how it does going forward.  Needs refill of her Progesterone. Notes no bleeding.  Review of Systems  Constitutional:  Negative for chills and fever.  Respiratory:  Negative for shortness of breath.   Cardiovascular:  Negative for chest pain.  Gastrointestinal:  Negative for abdominal pain, nausea and vomiting.  Genitourinary:  Negative for dysuria.  Skin:  Negative for rash.     Objective:    BP 123/76   Pulse 88   Wt 130 lb 6.4 oz (59.1 kg)   BMI 21.05 kg/m  Physical Exam Constitutional:      General: She is not in acute distress.    Appearance: She is well-developed.  HENT:     Head: Normocephalic and atraumatic.  Eyes:     General: No scleral icterus. Cardiovascular:     Rate and Rhythm: Normal rate.  Pulmonary:     Effort: Pulmonary effort is normal.  Abdominal:     Palpations: Abdomen is soft.  Genitourinary:    Comments: BUS normal, vagina is atrophic, cervix is without lesion, uterus is small and anteverted, no adnexal mass or tenderness.  Musculoskeletal:     Cervical back: Neck supple.  Skin:    General: Skin is warm and dry.  Neurological:     Mental Status: She is alert and oriented to person, place, and time.        Assessment & Plan:   Problem List Items Addressed This Visit       Unprioritized   Estrogen deficiency    Still with intact uterus, needs estrogen and progesterone. If her newest generic is not sticking well, may need to not use generic. Will monitor. Return if any bleeding noted.       Other Visit Diagnoses     Screening for cervical cancer    -  Primary   desires cervical cancer screening, provided today   Relevant Orders   Cytology - PAP( [place])         Return  in about 1 year (around 06/13/2022).  Donnamae Jude 06/13/2021 9:53 PM

## 2021-06-13 NOTE — Progress Notes (Signed)
Refill progesterone

## 2021-06-18 LAB — CYTOLOGY - PAP
Comment: NEGATIVE
Diagnosis: NEGATIVE
High risk HPV: NEGATIVE

## 2021-07-23 ENCOUNTER — Other Ambulatory Visit: Payer: Self-pay | Admitting: Internal Medicine

## 2021-07-23 DIAGNOSIS — Z1231 Encounter for screening mammogram for malignant neoplasm of breast: Secondary | ICD-10-CM

## 2021-08-01 ENCOUNTER — Other Ambulatory Visit: Payer: Self-pay | Admitting: Internal Medicine

## 2021-08-01 DIAGNOSIS — Z1211 Encounter for screening for malignant neoplasm of colon: Secondary | ICD-10-CM

## 2021-08-28 ENCOUNTER — Other Ambulatory Visit: Payer: Self-pay | Admitting: Internal Medicine

## 2021-08-28 DIAGNOSIS — Z1212 Encounter for screening for malignant neoplasm of rectum: Secondary | ICD-10-CM

## 2021-08-28 DIAGNOSIS — Z1211 Encounter for screening for malignant neoplasm of colon: Secondary | ICD-10-CM

## 2021-09-04 DIAGNOSIS — Z1212 Encounter for screening for malignant neoplasm of rectum: Secondary | ICD-10-CM | POA: Diagnosis not present

## 2021-09-04 DIAGNOSIS — Z1211 Encounter for screening for malignant neoplasm of colon: Secondary | ICD-10-CM | POA: Diagnosis not present

## 2021-09-06 ENCOUNTER — Other Ambulatory Visit: Payer: Self-pay

## 2021-09-06 ENCOUNTER — Ambulatory Visit
Admission: RE | Admit: 2021-09-06 | Discharge: 2021-09-06 | Disposition: A | Discharge status: Home / Self Care | Payer: PPO | Source: Ambulatory Visit | Attending: Internal Medicine | Admitting: Internal Medicine

## 2021-09-06 DIAGNOSIS — Z1231 Encounter for screening mammogram for malignant neoplasm of breast: Secondary | ICD-10-CM

## 2021-09-12 LAB — COLOGUARD: COLOGUARD: NEGATIVE

## 2021-09-12 NOTE — Progress Notes (Signed)
============================================================ ============================================================  -    Cologard - > Negative  - Great  - Repeat in 3 years   <><><><><><><><><><><><><><><><><><><><><><><><><><><><><><><><><> <><><><><><><><><><><><><><><><><><><><><><><><><><><><><><><><><>     

## 2021-09-19 ENCOUNTER — Encounter: Payer: Self-pay | Admitting: Internal Medicine

## 2021-09-19 NOTE — Patient Instructions (Signed)

## 2021-09-19 NOTE — Progress Notes (Signed)
Annual Screening/Preventative Visit & Comprehensive Evaluation &  Examination  Future Appointments  Date Time Provider [place]  09/20/2021  2:00 PM Unk Pinto, MD [facility] None  04/16/2022  2:30 PM Liane Comber, NP [facility] None  09/23/2022  2:00 PM Unk Pinto, MD [facility] None        This very nice 70 y.o. MWF  presents for a Screening /Preventative Visit & comprehensive evaluation and management of multiple medical co-morbidities.  Patient has been followed for HTN, HLD, Prediabetes  and Vitamin D Deficiency. Patient has hx/o Vitamin B12 deficiency & has been on replacement therapy.       Today,   patient recounts her LS X-rays from last Sept 2021 showing  mild scoliosis and minimal spondylosis and ongoing LBP along with pains in her hands & legs.  She requests Rheumatology consultation.    [[   COPIED from Oct 2019 - Patient has a very remote hx/o Galactorrhea and elevated Prolactin levels and a ? of a Pituitary Microadenoma and in 2010 Dr Ellene Route felt that her previous MRI's had been over interpreted and that she did not have a Pituitary Adenoma. She has had annual Visual Field screening by Dr Claudean Kinds for years  ]]            Patient has hx/o an elevated BP in 2016 and has been monitored since expectantly. . Patient's BP has been controlled at home and patient denies any cardiac symptoms as chest pain, palpitations, shortness of breath, dizziness or ankle swelling. Today's BP   is at goal - 118/70.         Patient's hyperlipidemia is controlled with diet. Last lipids were at goal :  Lab Results  Component Value Date   CHOL 176 09/07/2020   HDL 101 09/07/2020   LDLCALC 59 09/07/2020   TRIG 76 09/07/2020   CHOLHDL 1.7 09/07/2020         Patient has been monitored expectantly for glucose intolerance and patient denies reactive hypoglycemic symptoms, visual blurring, diabetic polys or paresthesias. Last A1c was normal & at goal :  Lab Results   Component Value Date   HGBA1C 5.0 09/07/2020         Finally, patient has history of Vitamin D Deficiency and last Vitamin D was   Lab Results  Component Value Date   VD25OH 63 09/07/2020     Current Outpatient Medications on File Prior to Visit  Medication Sig   acyclovir ointment   Apply every 3 hours.   aspirin 81 MG tablet Take  daily.   CALCIUM CITRATE  b Take 1 tablet daily.     VITAMIN D 5000 u tabs Takes Daily   Vitamin B-12  1000 MCG SL Place 1,000 mcg under the tongue daily.    estradiol (VIVELLE-DOT) 0.0375 MG/24HR APPLY ONE PATCH TWICE WEEKLY   Magnesium 250 MG TABS Take  daily.    progesterone 200 MG capsule Takes 1 capsule for 12 days alternate months   Sulfacetamide Sodium, Acne, 10 % LOTN Apply 1 to 2 times daily.     Allergies  Allergen Reactions   Pseudoephedrine-Dm-Gg     Increased Heart rate   Seldane [Terfenadine]     Rapid palpitations   Shellfish Allergy     N/V, diarrhea     Past Medical History:  Diagnosis Date   Allergy    Aphthous stomatitis    B12 deficiency    Galactorrhea    with elevated Prolactin levels   Hemorrhoid  HSV-1 (herpes simplex virus 1) infection    IBS (irritable bowel syndrome)    NSAID induced gastritis 04/13/2021   Pituitary microadenoma (HCC)    Synovitis of finger 2003   Left ring finger   Tennis elbow 2007   Right elbow   Vitamin D deficiency      Health Maintenance  Topic Date Due   Zoster Vaccines- Shingrix (1 of 2) Never done   COVID-19 Vaccine (5 - Booster for Pfizer series) 05/08/2021   INFLUENZA VACCINE  06/11/2021   MAMMOGRAM  09/06/2022   Fecal DNA (Cologuard)  09/04/2024   TETANUS/TDAP  09/28/2024   Pneumonia Vaccine 1+ Years old  Completed   DEXA SCAN  Completed   Hepatitis C Screening  Completed   HPV VACCINES  Aged Out     Immunization History  Administered Date(s) Administered   DT  09/28/2014   Influenza Inj Mdck Quad  08/14/2017   Influenza Split 09/28/2013, 09/28/2014    Influenza, High Dose  09/03/2018, 08/23/2019, 09/07/2020   Influenza 09/22/2015   Influenza,inj,quad 09/27/2016   PFIZER SARS-COV-2 Vacc 12/04/2019, 12/25/2019, 08/18/2020, 03/13/2021   PPD Test 09/28/2013, 08/14/2017   Pneumococcal -13 09/28/2014   Pneumococcal -23 09/03/2018   Pneumococcal-23 11/11/1998   Td 11/12/2003   Zoster, Live 11/11/2005    Last Colon - 06/21/2008 - Dr Henrene Pastor   Cologard - 09/24/2018 - Negative  - recc 3 yr f/u due Nov 2022   Last MGM - 09/07/2021 Last BMD -  09/05/2020  Past Surgical History:  Procedure Laterality Date   DILATION AND CURETTAGE OF [place]     Family History  Problem Relation Age of Onset   Cancer Mother        Breast   Ulcerative colitis Mother    Breast cancer Mother 43   Arthritis Father    Hypertension Father    Macular degeneration Father    Breast cancer Paternal Aunt      Social History   Tobacco Use   Smoking status: Never   Smokeless tobacco: Never  Vaping Use   Vaping Use: Never used  Substance Use Topics   Alcohol use: Yes    Alcohol/week: 14.0 standard drinks    Types: 14 Standard drinks or equivalent per week   Drug use: Not Currently      ROS Constitutional: Denies fever, chills, weight loss/gain, headaches, insomnia,  night sweats, and change in appetite. Does c/o fatigue. Eyes: Denies redness, blurred vision, diplopia, discharge, itchy, watery eyes.  ENT: Denies discharge, congestion, post nasal drip, epistaxis, sore throat, earache, hearing loss, dental pain, Tinnitus, Vertigo, Sinus pain, snoring.  Cardio: Denies chest pain, palpitations, irregular heartbeat, syncope, dyspnea, diaphoresis, orthopnea, PND, claudication, edema Respiratory: denies cough, dyspnea, DOE, pleurisy, hoarseness, laryngitis, wheezing.  Gastrointestinal: Denies dysphagia, heartburn, reflux, water brash, pain, cramps, nausea, vomiting, bloating, diarrhea, constipation, hematemesis, melena, hematochezia, jaundice,  hemorrhoids Genitourinary: Denies dysuria, frequency, urgency, nocturia, hesitancy, discharge, hematuria, flank pain Breast: Breast lumps, nipple discharge, bleeding.  Musculoskeletal: Denies arthralgia, myalgia, stiffness, Jt. Swelling, pain, limp, and strain/sprain. Denies falls. Skin: Denies puritis, rash, hives, warts, acne, eczema, changing in skin lesion Neuro: No weakness, tremor, incoordination, spasms, paresthesia, pain Psychiatric: Denies confusion, memory loss, sensory loss. Denies Depression. Endocrine: Denies change in weight, skin, hair change, nocturia, and paresthesia, diabetic polys, visual blurring, hyper / hypo glycemic episodes.  Heme/Lymph: No excessive bleeding, bruising, enlarged lymph nodes.  Physical Exam  BP 118/70   Pulse 86   Temp 97.9  F (36.6 C)   Resp 16   Ht 5\' 6"  (1.676 m)   Wt 131 lb 6.4 oz (59.6 kg)   SpO2 97%   BMI 21.21 kg/m   General Appearance: Well nourished, well groomed and in no apparent distress.  Eyes: PERRLA, EOMs, conjunctiva no swelling or erythema, normal fundi and vessels. Sinuses: No frontal/maxillary tenderness ENT/Mouth: EACs patent / TMs  nl. Nares clear without erythema, swelling, mucoid exudates. Oral hygiene is good. No erythema, swelling, or exudate. Tongue normal, non-obstructing. Tonsils not swollen or erythematous. Hearing normal.  Neck: Supple, thyroid not palpable. No bruits, nodes or JVD. Respiratory: Respiratory effort normal.  BS equal and clear bilateral without rales, rhonci, wheezing or stridor. Cardio: Heart sounds are normal with regular rate and rhythm and no murmurs, rubs or gallops. Peripheral pulses are normal and equal bilaterally without edema. No aortic or femoral bruits. Chest: symmetric with normal excursions and percussion. Breasts: Symmetric, without lumps, nipple discharge, retractions, or fibrocystic changes.  Abdomen: Flat, soft with bowel sounds active. Nontender, no guarding, rebound, hernias,  masses, or organomegaly.  Lymphatics: Non tender without lymphadenopathy.  Genitourinary:  Musculoskeletal: Full ROM all peripheral extremities, joint stability, 5/5 strength, and normal gait. Skin: Warm and dry without rashes, lesions, cyanosis, clubbing or  ecchymosis.  Neuro: Cranial nerves intact, reflexes equal bilaterally. Normal muscle tone, no cerebellar symptoms. Sensation intact.  Pysch: Alert and oriented X 3, normal affect, Insight and Judgment appropriate.    Assessment and Plan  1. Annual Preventative Screening Examination   2. Elevated BP without diagnosis of hypertension  - EKG 12-Lead - Urinalysis, Routine w reflex microscopic - Microalbumin / creatinine urine ratio - CBC with Differential/Platelet - COMPLETE METABOLIC PANEL WITH GFR - Magnesium - TSH  3. Lipid screening  - EKG 12-Lead - Lipid panel - TSH  4. Abnormal glucose  - EKG 12-Lead - Hemoglobin A1c - Insulin, random  5. Vitamin D deficiency   6. B12 deficiency  - Vitamin B12  7. Arthralgia, multiple   8. Chronic bilateral back pain   9. Gastroesophageal reflux disease without esophagitis  - CBC with Differential/Platelet  10. Need for immunization against influenza  - Flu vaccine HIGH DOSE PF (Fluzone High dose)  11. Screening for colorectal cancer  - Cologuard; Future  12. Screening for ischemic heart disease  - EKG 12-Lead  13. FH: hypertension  - EKG 12-Lead  14. Medication management  - Urinalysis, Routine w reflex microscopic - Microalbumin / creatinine urine ratio - POC Hemoccult Bld/Stl                                                              - Vitamin B12 - CBC with Differential/Platelet - COMPLETE METABOLIC PANEL WITH GFR - Magnesium - Lipid panel - TSH - Hemoglobin A1c - Insulin, random - VITAMIN D 25 Hydroxy            Patient was counseled in prudent diet to achieve/maintain BMI less than 25 for weight control, BP monitoring, regular  exercise and medications. Discussed med's effects and SE's. Screening labs and tests as requested with regular follow-up as recommended. Over 40 minutes of exam, counseling, chart review and high complex critical decision making was performed.   Kirtland Bouchard, MD

## 2021-09-20 ENCOUNTER — Ambulatory Visit (INDEPENDENT_AMBULATORY_CARE_PROVIDER_SITE_OTHER): Payer: PPO | Admitting: Internal Medicine

## 2021-09-20 ENCOUNTER — Other Ambulatory Visit: Payer: Self-pay

## 2021-09-20 ENCOUNTER — Encounter: Payer: Self-pay | Admitting: Internal Medicine

## 2021-09-20 VITALS — BP 118/70 | HR 86 | Temp 97.9°F | Resp 16 | Ht 66.0 in | Wt 131.4 lb

## 2021-09-20 DIAGNOSIS — Z23 Encounter for immunization: Secondary | ICD-10-CM

## 2021-09-20 DIAGNOSIS — Z13220 Encounter for screening for lipoid disorders: Secondary | ICD-10-CM | POA: Diagnosis not present

## 2021-09-20 DIAGNOSIS — K219 Gastro-esophageal reflux disease without esophagitis: Secondary | ICD-10-CM | POA: Diagnosis not present

## 2021-09-20 DIAGNOSIS — R7309 Other abnormal glucose: Secondary | ICD-10-CM | POA: Diagnosis not present

## 2021-09-20 DIAGNOSIS — R030 Elevated blood-pressure reading, without diagnosis of hypertension: Secondary | ICD-10-CM

## 2021-09-20 DIAGNOSIS — Z0001 Encounter for general adult medical examination with abnormal findings: Secondary | ICD-10-CM

## 2021-09-20 DIAGNOSIS — Z0000 Encounter for general adult medical examination without abnormal findings: Secondary | ICD-10-CM | POA: Diagnosis not present

## 2021-09-20 DIAGNOSIS — Z79899 Other long term (current) drug therapy: Secondary | ICD-10-CM

## 2021-09-20 DIAGNOSIS — E559 Vitamin D deficiency, unspecified: Secondary | ICD-10-CM | POA: Diagnosis not present

## 2021-09-20 DIAGNOSIS — M2550 Pain in unspecified joint: Secondary | ICD-10-CM

## 2021-09-20 DIAGNOSIS — Z136 Encounter for screening for cardiovascular disorders: Secondary | ICD-10-CM

## 2021-09-20 DIAGNOSIS — E538 Deficiency of other specified B group vitamins: Secondary | ICD-10-CM

## 2021-09-20 DIAGNOSIS — Z8249 Family history of ischemic heart disease and other diseases of the circulatory system: Secondary | ICD-10-CM

## 2021-09-20 DIAGNOSIS — G8929 Other chronic pain: Secondary | ICD-10-CM

## 2021-09-20 DIAGNOSIS — Z1211 Encounter for screening for malignant neoplasm of colon: Secondary | ICD-10-CM

## 2021-09-21 LAB — COMPLETE METABOLIC PANEL WITH GFR
AG Ratio: 1.9 (calc) (ref 1.0–2.5)
ALT: 16 U/L (ref 6–29)
AST: 18 U/L (ref 10–35)
Albumin: 4.4 g/dL (ref 3.6–5.1)
Alkaline phosphatase (APISO): 52 U/L (ref 37–153)
BUN: 12 mg/dL (ref 7–25)
CO2: 27 mmol/L (ref 20–32)
Calcium: 9.4 mg/dL (ref 8.6–10.4)
Chloride: 101 mmol/L (ref 98–110)
Creat: 0.67 mg/dL (ref 0.50–1.05)
Globulin: 2.3 g/dL (calc) (ref 1.9–3.7)
Glucose, Bld: 95 mg/dL (ref 65–99)
Potassium: 3.9 mmol/L (ref 3.5–5.3)
Sodium: 138 mmol/L (ref 135–146)
Total Bilirubin: 0.4 mg/dL (ref 0.2–1.2)
Total Protein: 6.7 g/dL (ref 6.1–8.1)
eGFR: 95 mL/min/{1.73_m2} (ref >=60)

## 2021-09-21 LAB — CBC WITH DIFFERENTIAL/PLATELET
Absolute Monocytes: 476 cells/uL (ref 200–950)
Basophils Absolute: 27 cells/uL (ref 0–200)
Basophils Relative: 0.4 %
Eosinophils Absolute: 122 cells/uL (ref 15–500)
Eosinophils Relative: 1.8 %
HCT: 36.5 % (ref 35.0–45.0)
Hemoglobin: 12 g/dL (ref 11.7–15.5)
Lymphs Abs: 1659 cells/uL (ref 850–3900)
MCH: 29.6 pg (ref 27.0–33.0)
MCHC: 32.9 g/dL (ref 32.0–36.0)
MCV: 89.9 fL (ref 80.0–100.0)
MPV: 9.4 fL (ref 7.5–12.5)
Monocytes Relative: 7 %
Neutro Abs: 4515 cells/uL (ref 1500–7800)
Neutrophils Relative %: 66.4 %
Platelets: 298 10*3/uL (ref 140–400)
RBC: 4.06 10*6/uL (ref 3.80–5.10)
RDW: 12.7 % (ref 11.0–15.0)
Total Lymphocyte: 24.4 %
WBC: 6.8 10*3/uL (ref 3.8–10.8)

## 2021-09-21 LAB — HEMOGLOBIN A1C
Hgb A1c MFr Bld: 5.1 % of total Hgb (ref <5.7)
Mean Plasma Glucose: 100 mg/dL
eAG (mmol/L): 5.5 mmol/L

## 2021-09-21 LAB — MICROALBUMIN / CREATININE URINE RATIO
Creatinine, Urine: 14 mg/dL — ABNORMAL LOW (ref 20–275)
Microalb, Ur: <0.2 mg/dL

## 2021-09-21 LAB — MAGNESIUM: Magnesium: 2.1 mg/dL (ref 1.5–2.5)

## 2021-09-21 LAB — VITAMIN B12: Vitamin B-12: 645 pg/mL (ref 200–1100)

## 2021-09-21 LAB — LIPID PANEL
Cholesterol: 180 mg/dL (ref <200)
HDL: 97 mg/dL (ref >=50)
LDL Cholesterol (Calc): 63 mg/dL (calc)
Non-HDL Cholesterol (Calc): 83 mg/dL (calc) (ref <130)
Total CHOL/HDL Ratio: 1.9 (calc) (ref <5.0)
Triglycerides: 115 mg/dL (ref <150)

## 2021-09-21 LAB — URINALYSIS, ROUTINE W REFLEX MICROSCOPIC
Bilirubin Urine: NEGATIVE
Glucose, UA: NEGATIVE
Hgb urine dipstick: NEGATIVE
Ketones, ur: NEGATIVE
Leukocytes,Ua: NEGATIVE
Nitrite: NEGATIVE
Protein, ur: NEGATIVE
Specific Gravity, Urine: 1.004 (ref 1.001–1.035)
pH: 7 (ref 5.0–8.0)

## 2021-09-21 LAB — VITAMIN D 25 HYDROXY (VIT D DEFICIENCY, FRACTURES): Vit D, 25-Hydroxy: 70 ng/mL (ref 30–100)

## 2021-09-21 LAB — INSULIN, RANDOM: Insulin: 3.9 u[IU]/mL

## 2021-09-21 LAB — TSH: TSH: 3.42 mIU/L (ref 0.40–4.50)

## 2021-09-22 NOTE — Progress Notes (Signed)
============================================================ -   Test results slightly outside the reference range are not unusual. If there is anything important, I will review this with you,  otherwise it is considered normal test values.  If you have further questions,  please do not hesitate to contact me at the office or via My Chart.  ============================================================ ============================================================  -  Total Chol = 180     &    LDL Chol = 63   - Both  Excellent   - Very low risk for Heart Attack  / Stroke ============================================================ ============================================================  -  A1c - Normal  - No Diabetes   - Great   ! ============================================================ ============================================================  -  Vitamin D = 70 - Excellent  ============================================================ ============================================================  -  All Else - CBC - Kidneys - Electrolytes - Liver - Magnesium & Thyroid    - all  Normal / OK ============================================================ ============================================================  -  Keep up the [place] Work  !  ============================================================ ============================================================

## 2021-09-23 ENCOUNTER — Other Ambulatory Visit: Payer: Self-pay | Admitting: Internal Medicine

## 2021-09-23 MED ORDER — HYOSCYAMINE SULFATE 0.125 MG PO TABS: ORAL_TABLET | ORAL | 2 refills | Status: AC

## 2021-09-26 ENCOUNTER — Encounter: Payer: PPO | Admitting: Internal Medicine

## 2021-10-02 DIAGNOSIS — L820 Inflamed seborrheic keratosis: Secondary | ICD-10-CM | POA: Diagnosis not present

## 2021-10-10 DIAGNOSIS — M549 Dorsalgia, unspecified: Secondary | ICD-10-CM | POA: Diagnosis not present

## 2021-10-10 DIAGNOSIS — M47819 Spondylosis without myelopathy or radiculopathy, site unspecified: Secondary | ICD-10-CM | POA: Diagnosis not present

## 2021-10-10 DIAGNOSIS — M79672 Pain in left foot: Secondary | ICD-10-CM | POA: Diagnosis not present

## 2021-10-10 DIAGNOSIS — M79641 Pain in right hand: Secondary | ICD-10-CM | POA: Diagnosis not present

## 2021-10-10 DIAGNOSIS — M1990 Unspecified osteoarthritis, unspecified site: Secondary | ICD-10-CM | POA: Diagnosis not present

## 2021-10-10 DIAGNOSIS — M79671 Pain in right foot: Secondary | ICD-10-CM | POA: Diagnosis not present

## 2021-10-10 DIAGNOSIS — M2550 Pain in unspecified joint: Secondary | ICD-10-CM | POA: Diagnosis not present

## 2021-10-10 DIAGNOSIS — M791 Myalgia, unspecified site: Secondary | ICD-10-CM | POA: Diagnosis not present

## 2021-10-10 DIAGNOSIS — M79642 Pain in left hand: Secondary | ICD-10-CM | POA: Diagnosis not present

## 2021-10-10 DIAGNOSIS — M359 Systemic involvement of connective tissue, unspecified: Secondary | ICD-10-CM | POA: Diagnosis not present

## 2021-12-04 DIAGNOSIS — M1990 Unspecified osteoarthritis, unspecified site: Secondary | ICD-10-CM | POA: Diagnosis not present

## 2021-12-04 DIAGNOSIS — M549 Dorsalgia, unspecified: Secondary | ICD-10-CM | POA: Diagnosis not present

## 2021-12-04 DIAGNOSIS — M2550 Pain in unspecified joint: Secondary | ICD-10-CM | POA: Diagnosis not present

## 2021-12-04 DIAGNOSIS — Z1589 Genetic susceptibility to other disease: Secondary | ICD-10-CM | POA: Diagnosis not present

## 2021-12-04 DIAGNOSIS — M791 Myalgia, unspecified site: Secondary | ICD-10-CM | POA: Diagnosis not present

## 2022-01-09 ENCOUNTER — Other Ambulatory Visit: Payer: Self-pay | Admitting: *Deleted

## 2022-01-09 MED ORDER — ESTRADIOL 0.0375 MG/24HR TD PTTW: MEDICATED_PATCH | TRANSDERMAL | 3 refills | Status: DC

## 2022-01-16 ENCOUNTER — Encounter: Payer: Self-pay | Admitting: *Deleted

## 2022-03-04 DIAGNOSIS — H1820 Unspecified corneal edema: Secondary | ICD-10-CM | POA: Diagnosis not present

## 2022-03-04 DIAGNOSIS — H04122 Dry eye syndrome of left lacrimal gland: Secondary | ICD-10-CM | POA: Diagnosis not present

## 2022-03-04 DIAGNOSIS — H1132 Conjunctival hemorrhage, left eye: Secondary | ICD-10-CM | POA: Diagnosis not present

## 2022-03-11 DIAGNOSIS — H524 Presbyopia: Secondary | ICD-10-CM | POA: Diagnosis not present

## 2022-03-11 DIAGNOSIS — H1132 Conjunctival hemorrhage, left eye: Secondary | ICD-10-CM | POA: Diagnosis not present

## 2022-03-11 DIAGNOSIS — H04123 Dry eye syndrome of bilateral lacrimal glands: Secondary | ICD-10-CM | POA: Diagnosis not present

## 2022-03-11 DIAGNOSIS — D352 Benign neoplasm of pituitary gland: Secondary | ICD-10-CM | POA: Diagnosis not present

## 2022-03-27 ENCOUNTER — Ambulatory Visit: Payer: PPO | Admitting: Family Medicine

## 2022-03-27 ENCOUNTER — Encounter: Payer: Self-pay | Admitting: Family Medicine

## 2022-03-27 VITALS — BP 120/73 | HR 83 | Ht 66.0 in | Wt 126.0 lb

## 2022-03-27 DIAGNOSIS — E2839 Other primary ovarian failure: Secondary | ICD-10-CM

## 2022-03-27 MED ORDER — ESTRADIOL 0.0375 MG/24HR TD PTTW: MEDICATED_PATCH | TRANSDERMAL | 3 refills | Status: AC

## 2022-03-27 MED ORDER — PROGESTERONE 200 MG PO CAPS: ORAL_CAPSULE | ORAL | 3 refills | Status: DC

## 2022-03-27 NOTE — Assessment & Plan Note (Signed)
We will continue the transdermal route as it is better tolerated, associated with better compliance, avoids first pass liver effects, decreased risk of VTE, decreased inflammation, and improved TG levels. ?

## 2022-03-27 NOTE — Progress Notes (Signed)
? ?  Subjective:  ? ? Patient ID: Kimberly Murphy is a 71 y.o. female presenting with No chief complaint on file. ? on 03/27/2022 ? ?HPI: ?Her insurance has changed and her insurance has recommended her to switch to the pill version. She has found it cheaper at [facility] with Good Rx card. She would like to know if she should change to oral estrogen. On oral progestin. ? ?Review of Systems  ?Constitutional:  Negative for chills and fever.  ?Respiratory:  Negative for shortness of breath.   ?Cardiovascular:  Negative for chest pain.  ?Gastrointestinal:  Negative for abdominal pain, nausea and vomiting.  ?Genitourinary:  Negative for dysuria.  ?Skin:  Negative for rash.  ?   ?Objective:  ?  ?BP 120/73   Pulse 83   Ht '5\' 6"'$  (1.676 m)   Wt 126 lb (57.2 kg)   BMI 20.34 kg/m?  ?Physical Exam ?Exam conducted with a chaperone present.  ?Constitutional:   ?   General: She is not in acute distress. ?   Appearance: She is well-developed.  ?HENT:  ?   Head: Normocephalic and atraumatic.  ?Eyes:  ?   General: No scleral icterus. ?Cardiovascular:  ?   Rate and Rhythm: Normal rate.  ?Pulmonary:  ?   Effort: Pulmonary effort is normal.  ?Abdominal:  ?   Palpations: Abdomen is soft.  ?Musculoskeletal:  ?   Cervical back: Neck supple.  ?Skin: ?   General: Skin is warm and dry.  ?Neurological:  ?   Mental Status: She is alert and oriented to person, place, and time.  ? ? ? ?   ?Assessment & Plan:  ? ?Problem List Items Addressed This Visit   ? ?  ? Unprioritized  ? Estrogen deficiency - Primary  ?  We will continue the transdermal route as it is better tolerated, associated with better compliance, avoids first pass liver effects, decreased risk of VTE, decreased inflammation, and improved TG levels. ? ?  ?  ? Relevant Medications  ? estradiol (VIVELLE-DOT) 0.0375 MG/24HR  ? progesterone (PROMETRIUM) 200 MG capsule  ? ? ?Return if symptoms worsen or fail to improve. ? ?Donnamae Jude, MD ?03/27/2022 ?11:31 AM ? ? ?

## 2022-04-02 ENCOUNTER — Encounter: Payer: Self-pay | Admitting: Internal Medicine

## 2022-04-02 NOTE — Patient Instructions (Addendum)

## 2022-04-02 NOTE — Progress Notes (Unsigned)
Future Appointments  Date Time Provider Department  04/03/2022                          OV 10:00 AM Unk Pinto, MD [facility]   Due November                 CPE 10:00 AM Unk Pinto, MD [facility]    History of Present Illness:       This very nice 71 y.o. MWF presents for 6 month follow up with hx/o elevated BP, controlled lipids, HLD, Pre-Diabetes and Vitamin D Deficiency.                                               Patient has a very remote hx/o Galactorrhea with elevated Prolactin levels and a ? of a Pituitary Microadenoma and in 2010 Dr Ellene Route felt that her previous MRI's had been over interpreted and that she did not have a Pituitary Adenoma.   She has had Visual Fields monitored in the past by Opthalmologist - Dr Claudean Kinds.        Patient has been followed since 2006 for labile elevated BP  & BP has been controlled at home. Today's BP is at goal -  116/72. Patient has had no complaints of any cardiac type chest pain, palpitations, dyspnea / orthopnea / PND, dizziness, claudication, or dependent edema.       Hyperlipidemia is controlled with diet. Last Lipids were at goal:  Lab Results  Component Value Date   CHOL 180 09/20/2021   HDL 97 09/20/2021   LDLCALC 63 09/20/2021   TRIG 115 09/20/2021   CHOLHDL 1.9 09/20/2021     Also, the patient has  been monitored expectantly for glucose intolerance & prediabetes and has had no symptoms of reactive hypoglycemia, diabetic polys, paresthesias or visual blurring.  Last A1c was at goal:  Lab Results  Component Value Date   HGBA1C 5.1 09/20/2021                                                            Further, the patient also has history of Vitamin D Deficiency ("44"/2019) and supplements vitamin D . Last vitamin D was at goal :  Lab Results  Component Value Date   VD25OH 8 09/20/2021     Current Outpatient Medications on File Prior to Visit  Medication Sig   acyclovir oint (ZOVIRAX) 5 % Apply 1  application topically every 3  hours.   aspirin 81 MG tablet Take  daily.   CALCIUM CITRATE  Take 1 tablet  daily.    VITAMIN D 5000 u Takes  Daily   Cyanocobalamin (B-12) 1000 mcg  SL Place under tongue daily.    estradiol (VIVELLE-DOT) 0.0375 MG/24HR APPLY ONE PATCH TWICE WEEK   hyoscyamine  0.125 MG tablet Take 1 tablet every 4 hrs if needed   Magnesium 250 MG TABS Take  daily.    progesterone  200 MG capsule Takes 1 capsule for 12 days alternate months   Sulfacetamide Sodium, Acne, 10 % LOTN Apply 1 to 2 times daily.    Allergies  Allergen Reactions  Pseudoephedrine-Dm-Gg     Increased Heart rate   Seldane [Terfenadine]     Rapid palpitations   Shellfish Allergy     N/V, diarrhea    PMHx:   Past Medical History:  Diagnosis Date   Allergy    Aphthous stomatitis    B12 deficiency    Galactorrhea    with elevated Prolactin levels   Hemorrhoid    HSV-1 (herpes simplex virus 1) infection    IBS (irritable bowel syndrome)    NSAID induced gastritis 04/13/2021   Pituitary microadenoma (HCC)    Synovitis of finger 2003   Left ring finger   Tennis elbow 2007   Right elbow   Vitamin D deficiency     Immunization History  Administered Date(s) Administered   DT (Pediatric) 09/28/2014   Influenza Inj Mdck Quad  08/14/2017   Influenza Split 09/28/2013, 09/28/2014   Influenza, High Dose  09/03/2018, 08/23/2019, 09/07/2020, 09/20/2021   Influenza, Seasonal 09/22/2015   Influenza,inj,quad 09/27/2016   PFIZER-SARS-COV-2 Vacc 12/04/2019, 12/25/2019, 08/18/2020, 03/13/2021   PPD Test 09/28/2013, 08/14/2017   Pneumococcal - 13 09/28/2014   Pneumococcal - 23 09/03/2018   Pneumococcal- 23 11/11/1998   Td 11/12/2003   Zoster, Live 11/11/2005    Past Surgical History:  Procedure Laterality Date   DILATION AND CURETTAGE OF UTERUS  1994 & 1996    FHx:    Reviewed / unchanged  SHx:    Reviewed / unchanged   Systems Review:  Constitutional: Denies fever, chills, wt  changes, headaches, insomnia, fatigue, night sweats, change in appetite. Eyes: Denies redness, blurred vision, diplopia, discharge, itchy, watery eyes.  ENT: Denies discharge, congestion, post nasal drip, epistaxis, sore throat, earache, hearing loss, dental pain, tinnitus, vertigo, sinus pain, snoring.  CV: Denies chest pain, palpitations, irregular heartbeat, syncope, dyspnea, diaphoresis, orthopnea, PND, claudication or edema. Respiratory: denies cough, dyspnea, DOE, pleurisy, hoarseness, laryngitis, wheezing.  Gastrointestinal: Denies dysphagia, odynophagia, heartburn, reflux, water brash, abdominal pain or cramps, nausea, vomiting, bloating, diarrhea, constipation, hematemesis, melena, hematochezia  or hemorrhoids. Genitourinary: Denies dysuria, frequency, urgency, nocturia, hesitancy, discharge, hematuria or flank pain. Musculoskeletal: Denies arthralgias, myalgias, stiffness, jt. swelling, pain, limping or strain/sprain.  Skin: Denies pruritus, rash, hives, warts, acne, eczema or change in skin lesion(s). Neuro: No weakness, tremor, incoordination, spasms, paresthesia or pain. Psychiatric: Denies confusion, memory loss or sensory loss. Endo: Denies change in weight, skin or hair change.  Heme/Lymph: No excessive bleeding, bruising or enlarged lymph nodes.  Physical Exam  BP 116/72   P 56   T  97.9 F   R 16   Ht '5\' 6"'$     Wt 127 lb 6.4 oz    SpO2 98%   BMI 20.56   Appears  well nourished, well groomed  and in no distress.  Eyes: PERRLA, EOMs, conjunctiva no swelling or erythema. Sinuses: No frontal/maxillary tenderness ENT/Mouth: EAC's clear, TM's nl w/o erythema, bulging. Nares clear w/o erythema, swelling, exudates. Oropharynx clear without erythema or exudates. Oral hygiene is good. Tongue normal, non obstructing. Hearing intact.  Neck: Supple. Thyroid not palpable. Car 2+/2+ without bruits, nodes or JVD. Chest: Respirations nl with BS clear & equal w/o rales, rhonchi,  wheezing or stridor.  Cor: Heart sounds normal w/ regular rate and rhythm without sig. murmurs, gallops, clicks or rubs. Peripheral pulses normal and equal  without edema.  Abdomen: Soft & bowel sounds normal. Non-tender w/o guarding, rebound, hernias, masses or organomegaly.  Lymphatics: Unremarkable.  Musculoskeletal: Full ROM all peripheral extremities, joint stability, 5/5  strength and normal gait.  Skin: Warm, dry without exposed rashes, lesions or ecchymosis apparent.  Neuro: Cranial nerves intact, reflexes equal bilaterally. Sensory-motor testing grossly intact. Tendon reflexes grossly intact.  Pysch: Alert & oriented x 3.  Insight and judgement nl & appropriate. No ideations.  Assessment and Plan:  1.  Hx Elevated BP without diagnosis of hypertension  - Continue medication, monitor blood pressure at home.  - Continue DASH diet.  Reminder to go to the ER if any CP,  SOB, nausea, dizziness, severe HA, changes vision/speech.   - CBC with Differential/Platelet - COMPLETE METABOLIC PANEL WITH GFR - Magnesium - TSH - Urinalysis, Routine w reflex microscopic  2. Lipid screening  - Continue diet/meds, exercise,& lifestyle modifications.  - Continue monitor periodic cholesterol/liver & renal functions    - Lipid panel - TSH  3. Abnormal glucose  - Continue diet, exercise  - Lifestyle modifications.  - Monitor appropriate labs    - Hemoglobin A1c - Insulin, random  4. Vitamin D deficiency  - Continue supplementation   - VITAMIN D 25 Hydroxy  5. Medication management - CBC with Differential/Platelet - COMPLETE METABOLIC PANEL WITH GFR - Magnesium - Lipid panel - TSH - Hemoglobin A1c - Insulin, random - VITAMIN D 25 Hydroxy  - Urinalysis, Routine w reflex microscopic          Discussed  regular exercise, BP monitoring, weight control to achieve/maintain BMI less than 25 and discussed med and SE's. Recommended labs to assess /monitor clinical status .  I  discussed the assessment and treatment plan with the patient. The patient was provided an opportunity to ask questions and all were answered. The patient agreed with the plan and demonstrated an understanding of the instructions.  I provided over 30 minutes of exam, counseling, chart review and  complex critical decision making.        The patient was advised to call back or seek an in-person evaluation if the symptoms worsen or if the condition fails to improve as anticipated.   Kirtland Bouchard, MD

## 2022-04-03 ENCOUNTER — Encounter: Payer: Self-pay | Admitting: Internal Medicine

## 2022-04-03 ENCOUNTER — Ambulatory Visit (INDEPENDENT_AMBULATORY_CARE_PROVIDER_SITE_OTHER): Payer: PPO | Admitting: Internal Medicine

## 2022-04-03 VITALS — BP 116/72 | HR 56 | Temp 97.9°F | Resp 16 | Ht 66.0 in | Wt 127.4 lb

## 2022-04-03 DIAGNOSIS — Z13220 Encounter for screening for lipoid disorders: Secondary | ICD-10-CM | POA: Diagnosis not present

## 2022-04-03 DIAGNOSIS — Z79899 Other long term (current) drug therapy: Secondary | ICD-10-CM

## 2022-04-03 DIAGNOSIS — Z23 Encounter for immunization: Secondary | ICD-10-CM | POA: Diagnosis not present

## 2022-04-03 DIAGNOSIS — R030 Elevated blood-pressure reading, without diagnosis of hypertension: Secondary | ICD-10-CM | POA: Diagnosis not present

## 2022-04-03 DIAGNOSIS — E538 Deficiency of other specified B group vitamins: Secondary | ICD-10-CM

## 2022-04-03 DIAGNOSIS — R7309 Other abnormal glucose: Secondary | ICD-10-CM | POA: Diagnosis not present

## 2022-04-03 DIAGNOSIS — E559 Vitamin D deficiency, unspecified: Secondary | ICD-10-CM

## 2022-04-04 LAB — LIPID PANEL
Cholesterol: 171 mg/dL (ref <200)
HDL: 96 mg/dL (ref >=50)
LDL Cholesterol (Calc): 59 mg/dL (calc)
Non-HDL Cholesterol (Calc): 75 mg/dL (calc) (ref <130)
Total CHOL/HDL Ratio: 1.8 (calc) (ref <5.0)
Triglycerides: 82 mg/dL (ref <150)

## 2022-04-04 LAB — HEMOGLOBIN A1C
Hgb A1c MFr Bld: 5.1 % of total Hgb (ref <5.7)
Mean Plasma Glucose: 100 mg/dL
eAG (mmol/L): 5.5 mmol/L

## 2022-04-04 LAB — CBC WITH DIFFERENTIAL/PLATELET
Absolute Monocytes: 460 cells/uL (ref 200–950)
Basophils Absolute: 69 cells/uL (ref 0–200)
Basophils Relative: 1.1 %
Eosinophils Absolute: 88 cells/uL (ref 15–500)
Eosinophils Relative: 1.4 %
HCT: 40.4 % (ref 35.0–45.0)
Hemoglobin: 13.2 g/dL (ref 11.7–15.5)
Lymphs Abs: 1556 cells/uL (ref 850–3900)
MCH: 29.5 pg (ref 27.0–33.0)
MCHC: 32.7 g/dL (ref 32.0–36.0)
MCV: 90.4 fL (ref 80.0–100.0)
MPV: 9.7 fL (ref 7.5–12.5)
Monocytes Relative: 7.3 %
Neutro Abs: 4127 cells/uL (ref 1500–7800)
Neutrophils Relative %: 65.5 %
Platelets: 281 10*3/uL (ref 140–400)
RBC: 4.47 10*6/uL (ref 3.80–5.10)
RDW: 13 % (ref 11.0–15.0)
Total Lymphocyte: 24.7 %
WBC: 6.3 10*3/uL (ref 3.8–10.8)

## 2022-04-04 LAB — URINALYSIS, ROUTINE W REFLEX MICROSCOPIC
Bilirubin Urine: NEGATIVE
Glucose, UA: NEGATIVE
Hgb urine dipstick: NEGATIVE
Ketones, ur: NEGATIVE
Leukocytes,Ua: NEGATIVE
Nitrite: NEGATIVE
Protein, ur: NEGATIVE
Specific Gravity, Urine: 1.006 (ref 1.001–1.035)
pH: 6.5 (ref 5.0–8.0)

## 2022-04-04 LAB — COMPLETE METABOLIC PANEL WITH GFR
AG Ratio: 1.8 (calc) (ref 1.0–2.5)
ALT: 16 U/L (ref 6–29)
AST: 17 U/L (ref 10–35)
Albumin: 4.7 g/dL (ref 3.6–5.1)
Alkaline phosphatase (APISO): 50 U/L (ref 37–153)
BUN: 16 mg/dL (ref 7–25)
CO2: 27 mmol/L (ref 20–32)
Calcium: 9.8 mg/dL (ref 8.6–10.4)
Chloride: 101 mmol/L (ref 98–110)
Creat: 0.71 mg/dL (ref 0.50–1.05)
Globulin: 2.6 g/dL (calc) (ref 1.9–3.7)
Glucose, Bld: 91 mg/dL (ref 65–99)
Potassium: 4.4 mmol/L (ref 3.5–5.3)
Sodium: 138 mmol/L (ref 135–146)
Total Bilirubin: 0.5 mg/dL (ref 0.2–1.2)
Total Protein: 7.3 g/dL (ref 6.1–8.1)
eGFR: 92 mL/min/{1.73_m2} (ref >=60)

## 2022-04-04 LAB — INSULIN, RANDOM: Insulin: 3.9 u[IU]/mL

## 2022-04-04 LAB — VITAMIN B12: Vitamin B-12: 712 pg/mL (ref 200–1100)

## 2022-04-04 LAB — MAGNESIUM: Magnesium: 2.2 mg/dL (ref 1.5–2.5)

## 2022-04-04 LAB — VITAMIN D 25 HYDROXY (VIT D DEFICIENCY, FRACTURES): Vit D, 25-Hydroxy: 75 ng/mL (ref 30–100)

## 2022-04-04 LAB — TSH: TSH: 3.22 mIU/L (ref 0.40–4.50)

## 2022-04-04 NOTE — Progress Notes (Signed)
<><><><><><><><><><><><><><><><><><><><><><><><><><><><><><><><><> <><><><><><><><><><><><><><><><><><><><><><><><><><><><><><><><><> -   Test results slightly outside the reference range are not unusual. If there is anything important, I will review this with you,  otherwise it is considered normal test values.  If you have further questions,  please do not hesitate to contact me at the office or via My Chart.  <><><><><><><><><><><><><><><><><><><><><><><><><><><><><><><><><> <><><><><><><><><><><><><><><><><><><><><><><><><><><><><><><><><>  -  Vitamin B12 - Normal  <><><><><><><><><><><><><><><><><><><><><><><><><><><><><><><><><>  - Total Chol = 171      &  LDL Chol = 59     - Both   , Excellent   - Very low risk for Heart Attack  / Stroke <><><><><><><><><><><><><><><><><><><><><><><><><><><><><><><><><>  - A1c - Normal  - No Diabetes   - Great !  <><><><><><><><><><><><><><><><><><><><><><><><><><><><><><><><><>  - Vitamin D = 75  - Excellent  / Wonderful !  - Please keep dose same  <><><><><><><><><><><><><><><><><><><><><><><><><><><><><><><><><>  - All Else - CBC - Kidneys - Electrolytes - Liver - Magnesium & Thyroid    - all  Normal / OK <><><><><><><><><><><><><><><><><><><><><><><><><><><><><><><><><> <><><><><><><><><><><><><><><><><><><><><><><><><><><><><><><><><>

## 2022-04-16 ENCOUNTER — Ambulatory Visit: Payer: Self-pay | Admitting: Adult Health

## 2022-04-24 ENCOUNTER — Encounter: Payer: PPO | Admitting: Internal Medicine

## 2022-05-07 DIAGNOSIS — D2271 Melanocytic nevi of right lower limb, including hip: Secondary | ICD-10-CM | POA: Diagnosis not present

## 2022-05-07 DIAGNOSIS — D2272 Melanocytic nevi of left lower limb, including hip: Secondary | ICD-10-CM | POA: Diagnosis not present

## 2022-05-07 DIAGNOSIS — D2262 Melanocytic nevi of left upper limb, including shoulder: Secondary | ICD-10-CM | POA: Diagnosis not present

## 2022-05-07 DIAGNOSIS — L821 Other seborrheic keratosis: Secondary | ICD-10-CM | POA: Diagnosis not present

## 2022-05-07 DIAGNOSIS — L814 Other melanin hyperpigmentation: Secondary | ICD-10-CM | POA: Diagnosis not present

## 2022-05-07 DIAGNOSIS — D225 Melanocytic nevi of trunk: Secondary | ICD-10-CM | POA: Diagnosis not present

## 2022-05-07 DIAGNOSIS — D485 Neoplasm of uncertain behavior of skin: Secondary | ICD-10-CM | POA: Diagnosis not present

## 2022-05-07 DIAGNOSIS — D692 Other nonthrombocytopenic purpura: Secondary | ICD-10-CM | POA: Diagnosis not present

## 2022-05-07 DIAGNOSIS — D2261 Melanocytic nevi of right upper limb, including shoulder: Secondary | ICD-10-CM | POA: Diagnosis not present

## 2022-05-29 ENCOUNTER — Encounter: Payer: Self-pay | Admitting: Internal Medicine

## 2022-05-29 NOTE — Telephone Encounter (Signed)
Can you add this

## 2022-08-12 ENCOUNTER — Encounter: Payer: Self-pay | Admitting: Internal Medicine

## 2022-09-23 ENCOUNTER — Encounter: Payer: PPO | Admitting: Internal Medicine

## 2023-03-05 ENCOUNTER — Other Ambulatory Visit: Payer: Self-pay | Admitting: Family Medicine

## 2023-03-05 DIAGNOSIS — E2839 Other primary ovarian failure: Secondary | ICD-10-CM

## 2023-04-14 ENCOUNTER — Encounter: Payer: PPO | Admitting: Internal Medicine

## 2024-02-24 ENCOUNTER — Other Ambulatory Visit: Payer: Self-pay | Admitting: Family Medicine

## 2024-02-24 DIAGNOSIS — E2839 Other primary ovarian failure: Secondary | ICD-10-CM
